# Patient Record
Sex: Male | Born: 1950 | Race: Black or African American | Hispanic: No | Marital: Married
Health system: Southern US, Academic
[De-identification: ages and names within clinical notes are randomized; demographics above are authoritative.]

## PROBLEM LIST (undated history)

## (undated) ENCOUNTER — Telehealth
Attending: Pharmacist Clinician (PhC)/ Clinical Pharmacy Specialist | Primary: Pharmacist Clinician (PhC)/ Clinical Pharmacy Specialist

## (undated) ENCOUNTER — Telehealth

## (undated) ENCOUNTER — Ambulatory Visit: Attending: Family Medicine | Primary: Family Medicine

## (undated) ENCOUNTER — Ambulatory Visit: Payer: MEDICARE | Attending: Family | Primary: Family

## (undated) ENCOUNTER — Ambulatory Visit

## (undated) ENCOUNTER — Telehealth: Attending: Podiatrist | Primary: Podiatrist

## (undated) ENCOUNTER — Encounter

## (undated) ENCOUNTER — Encounter: Attending: Family Medicine | Primary: Family Medicine

## (undated) ENCOUNTER — Ambulatory Visit: Payer: MEDICARE | Attending: Gastroenterology | Primary: Gastroenterology

## (undated) ENCOUNTER — Ambulatory Visit: Payer: MEDICARE | Attending: Podiatrist | Primary: Podiatrist

## (undated) DIAGNOSIS — Z87442 Personal history of urinary calculi: Secondary | ICD-10-CM

## (undated) DIAGNOSIS — E119 Type 2 diabetes mellitus without complications: Secondary | ICD-10-CM

## (undated) HISTORY — PX: ABDOMINAL SURGERY: SHX537

## (undated) HISTORY — PX: FOOT SURGERY: SHX648

## (undated) HISTORY — PX: THROAT SURGERY: SHX803

---

## 2005-11-19 ENCOUNTER — Emergency Department (HOSPITAL_COMMUNITY): Admission: EM | Admit: 2005-11-19 | Discharge: 2005-11-19 | Payer: Self-pay | Admitting: Emergency Medicine

## 2010-04-12 ENCOUNTER — Ambulatory Visit (HOSPITAL_COMMUNITY): Admission: RE | Admit: 2010-04-12 | Discharge: 2010-04-12 | Payer: Self-pay | Admitting: Family Medicine

## 2012-09-10 ENCOUNTER — Emergency Department (HOSPITAL_COMMUNITY)
Admission: EM | Admit: 2012-09-10 | Discharge: 2012-09-10 | Disposition: A | Discharge status: Home / Self Care | Payer: Medicare Other | Attending: Emergency Medicine | Admitting: Emergency Medicine

## 2012-09-10 ENCOUNTER — Encounter (HOSPITAL_COMMUNITY): Payer: Self-pay

## 2012-09-10 DIAGNOSIS — T17208A Unspecified foreign body in pharynx causing other injury, initial encounter: Secondary | ICD-10-CM

## 2012-09-10 DIAGNOSIS — Y9389 Activity, other specified: Secondary | ICD-10-CM | POA: Insufficient documentation

## 2012-09-10 DIAGNOSIS — Y929 Unspecified place or not applicable: Secondary | ICD-10-CM | POA: Insufficient documentation

## 2012-09-10 DIAGNOSIS — IMO0002 Reserved for concepts with insufficient information to code with codable children: Secondary | ICD-10-CM | POA: Insufficient documentation

## 2012-09-10 NOTE — ED Provider Notes (Signed)
Medical screening examination/treatment/procedure(s) were performed by non-physician practitioner and as supervising physician I was immediately available for consultation/collaboration.   Flint Melter, MD 09/10/12 [phone]

## 2012-09-10 NOTE — ED Notes (Signed)
Pt reports eating some fish last night and reports that a fish bone is stuck in his throat.  Pt has tried multiple things at home to remove w/out success.  Pt denies any sob or difficulty breathing.

## 2012-09-10 NOTE — ED Provider Notes (Signed)
History     CSN: [id number]  Arrival date & time 09/10/12  1042   First MD Initiated Contact with Patient 09/10/12 1048      Chief Complaint  Patient presents with  . Swallowed Foreign Body    (Consider location/radiation/quality/duration/timing/severity/associated sxs/prior treatment) Patient is a 62 y.o. male presenting with foreign body swallowed. The history is provided by the patient and the spouse.  Swallowed Foreign Body This is a new problem. The current episode started yesterday. The problem has been unchanged. Associated symptoms include a sore throat. Pertinent negatives include no abdominal pain, arthralgias, chest pain, congestion, coughing, fever, headaches, joint swelling, nausea, neck pain, numbness, rash, swollen glands, vomiting or weakness. Associated symptoms comments: No sob,  No chest pain.. The symptoms are aggravated by swallowing. Treatments tried: He has tried dislodging the bone with his toothbrush,  has also eaten bananas, peanut butter trying to dislodge it. The treatment provided no relief.    History reviewed. No pertinent past medical history.  Past Surgical History  Procedure Date  . Throat surgery   . Foot surgery     No family history on file.  History  Substance Use Topics  . Smoking status: Never Smoker   . Smokeless tobacco: Not on file  . Alcohol Use: Yes      Review of Systems  Constitutional: Negative for fever.  HENT: Positive for sore throat. Negative for congestion and neck pain.   Eyes: Negative.   Respiratory: Negative for cough, choking, chest tightness, shortness of breath, wheezing and stridor.   Cardiovascular: Negative for chest pain.  Gastrointestinal: Negative for nausea, vomiting and abdominal pain.  Genitourinary: Negative.   Musculoskeletal: Negative for joint swelling and arthralgias.  Skin: Negative.  Negative for rash and wound.  Neurological: Negative for dizziness, weakness, light-headedness, numbness and  headaches.  Hematological: Negative.   Psychiatric/Behavioral: Negative.     Allergies  Review of patient's allergies indicates no known allergies.  Home Medications   Current Outpatient Rx  Name  Route  Sig  Dispense  Refill  . HYDROCHLOROTHIAZIDE 12.5 MG PO CAPS   Oral   Take 12.5 mg by mouth daily.           BP 151/78  Pulse 84  Temp 98.3 F (36.8 C) (Oral)  Resp 20  Ht 5\' 10"  (1.778 m)  Wt 225 lb (102.059 kg)  BMI 32.28 kg/m2  SpO2 97%  Physical Exam  Nursing note and vitals reviewed. Constitutional: He is oriented to person, place, and time. He appears well-developed and well-nourished.  HENT:  Head: Normocephalic and atraumatic.       Small fish bone in horizontal position lodged behind left pharyngeal tonsillar fold.  Eyes: Conjunctivae normal are normal.  Neck: Normal range of motion. Neck supple.  Cardiovascular: Normal rate.   Pulmonary/Chest: Effort normal and breath sounds normal. No stridor. No respiratory distress. He has no wheezes.  Abdominal: He exhibits no distension.  Musculoskeletal: Normal range of motion.  Neurological: He is alert and oriented to person, place, and time.  Skin: Skin is warm and dry.  Psychiatric: He has a normal mood and affect.    ED Course  FOREIGN BODY REMOVAL Date/Time: 09/10/2012 11:13 AM Performed by: Burgess Amor Authorized by: Burgess Amor Consent: Verbal consent obtained. Risks and benefits: risks, benefits and alternatives were discussed Consent given by: patient Patient identity confirmed: verbally with patient Time out: Immediately prior to procedure a "time out" was called to verify the correct patient, procedure, equipment,  support staff and site/side marked as required. Body area: throat Local anesthetic: topical anesthetic Localization method: visualized Removal mechanism: alligator forceps Complexity: simple 1 objects recovered. Objects recovered: fish bone Post-procedure assessment: foreign body  removed Patient tolerance: Patient tolerated the procedure well with no immediate complications.   (including critical care time)  Labs Reviewed - No data to display No results found.   1. Foreign body in pharynx       MDM  Prn f/u anticipated.  No complications from simple fish bone removal.        Burgess Amor, PA 09/10/12 1118

## 2019-04-29 DIAGNOSIS — M722 Plantar fascial fibromatosis: Secondary | ICD-10-CM

## 2019-05-10 DIAGNOSIS — B1920 Unspecified viral hepatitis C without hepatic coma: Secondary | ICD-10-CM

## 2019-05-14 ENCOUNTER — Ambulatory Visit: Admit: 2019-05-14 | Discharge: 2019-05-15 | Payer: MEDICARE

## 2019-05-14 DIAGNOSIS — B1920 Unspecified viral hepatitis C without hepatic coma: Secondary | ICD-10-CM

## 2019-05-14 DIAGNOSIS — K8020 Calculus of gallbladder without cholecystitis without obstruction: Secondary | ICD-10-CM

## 2019-05-21 DIAGNOSIS — B1920 Unspecified viral hepatitis C without hepatic coma: Secondary | ICD-10-CM

## 2019-05-28 ENCOUNTER — Encounter (HOSPITAL_COMMUNITY): Payer: Self-pay | Admitting: *Deleted

## 2019-05-28 ENCOUNTER — Other Ambulatory Visit: Payer: Self-pay

## 2019-05-28 ENCOUNTER — Emergency Department (HOSPITAL_COMMUNITY)
Admission: EM | Admit: 2019-05-28 | Discharge: 2019-05-28 | Disposition: A | Discharge status: Home / Self Care | Payer: Medicare HMO | Attending: Emergency Medicine | Admitting: Emergency Medicine

## 2019-05-28 ENCOUNTER — Emergency Department (HOSPITAL_COMMUNITY): Payer: Medicare HMO

## 2019-05-28 DIAGNOSIS — K8020 Calculus of gallbladder without cholecystitis without obstruction: Secondary | ICD-10-CM | POA: Diagnosis not present

## 2019-05-28 DIAGNOSIS — R1031 Right lower quadrant pain: Secondary | ICD-10-CM | POA: Diagnosis present

## 2019-05-28 DIAGNOSIS — R112 Nausea with vomiting, unspecified: Secondary | ICD-10-CM | POA: Insufficient documentation

## 2019-05-28 DIAGNOSIS — K8050 Calculus of bile duct without cholangitis or cholecystitis without obstruction: Secondary | ICD-10-CM

## 2019-05-28 LAB — CBC WITH DIFFERENTIAL/PLATELET
Abs Immature Granulocytes: 0.02 10*3/uL (ref 0.00–0.07)
Basophils Absolute: 0.1 10*3/uL (ref 0.0–0.1)
Basophils Relative: 1 %
Eosinophils Absolute: 0.2 10*3/uL (ref 0.0–0.5)
Eosinophils Relative: 2 %
HCT: 42.9 % (ref 39.0–52.0)
Hemoglobin: 13.2 g/dL (ref 13.0–17.0)
Immature Granulocytes: 0 %
Lymphocytes Relative: 23 %
Lymphs Abs: 2.4 10*3/uL (ref 0.7–4.0)
MCH: 27.7 pg (ref 26.0–34.0)
MCHC: 30.8 g/dL (ref 30.0–36.0)
MCV: 90.1 fL (ref 80.0–100.0)
Monocytes Absolute: 0.7 10*3/uL (ref 0.1–1.0)
Monocytes Relative: 6 %
Neutro Abs: 6.9 10*3/uL (ref 1.7–7.7)
Neutrophils Relative %: 68 %
Platelets: 184 10*3/uL (ref 150–400)
RBC: 4.76 MIL/uL (ref 4.22–5.81)
RDW: 13.5 % (ref 11.5–15.5)
WBC: 10.2 10*3/uL (ref 4.0–10.5)
nRBC: 0 % (ref 0.0–0.2)

## 2019-05-28 LAB — COMPREHENSIVE METABOLIC PANEL WITH GFR
ALT: 33 U/L (ref 0–44)
AST: 34 U/L (ref 15–41)
Albumin: 4.2 g/dL (ref 3.5–5.0)
Alkaline Phosphatase: 74 U/L (ref 38–126)
Anion gap: 7 (ref 5–15)
BUN: 13 mg/dL (ref 8–23)
CO2: 26 mmol/L (ref 22–32)
Calcium: 9.2 mg/dL (ref 8.9–10.3)
Chloride: 101 mmol/L (ref 98–111)
Creatinine, Ser: 0.91 mg/dL (ref 0.61–1.24)
GFR calc Af Amer: >60 mL/min
GFR calc non Af Amer: >60 mL/min
Glucose, Bld: 189 mg/dL — ABNORMAL HIGH (ref 70–99)
Potassium: 4.1 mmol/L (ref 3.5–5.1)
Sodium: 134 mmol/L — ABNORMAL LOW (ref 135–145)
Total Bilirubin: 0.5 mg/dL (ref 0.3–1.2)
Total Protein: 8 g/dL (ref 6.5–8.1)

## 2019-05-28 LAB — URINALYSIS, ROUTINE W REFLEX MICROSCOPIC
Bilirubin Urine: NEGATIVE
Glucose, UA: NEGATIVE mg/dL
Hgb urine dipstick: NEGATIVE
Ketones, ur: NEGATIVE mg/dL
Leukocytes,Ua: NEGATIVE
Nitrite: NEGATIVE
Protein, ur: NEGATIVE mg/dL
Specific Gravity, Urine: 1.023 (ref 1.005–1.030)
pH: 6 (ref 5.0–8.0)

## 2019-05-28 LAB — LIPASE, BLOOD: Lipase: 21 U/L (ref 11–51)

## 2019-05-28 MED ORDER — HYDROMORPHONE HCL 1 MG/ML IJ SOLN
1.0000 mg | Freq: Once | INTRAMUSCULAR | Status: AC
  Administered 2019-05-28: 16:00:00 1 mg via INTRAVENOUS
  Filled 2019-05-28: qty 1

## 2019-05-28 MED ORDER — ONDANSETRON 4 MG PO TBDP: 4.0000 mg | ORAL_TABLET | Freq: Three times a day (TID) | ORAL | 1 refills | Status: DC | PRN

## 2019-05-28 MED ORDER — ONDANSETRON HCL 4 MG/2ML IJ SOLN
4.0000 mg | Freq: Once | INTRAMUSCULAR | Status: AC
  Administered 2019-05-28: 4 mg via INTRAVENOUS
  Filled 2019-05-28: qty 2

## 2019-05-28 MED ORDER — SODIUM CHLORIDE 0.9 % IV SOLN
INTRAVENOUS | Status: DC
  Administered 2019-05-28: 16:00:00 via INTRAVENOUS

## 2019-05-28 MED ORDER — HYDROCODONE-ACETAMINOPHEN 5-325 MG PO TABS: 1.0000 | ORAL_TABLET | Freq: Four times a day (QID) | ORAL | 0 refills | Status: DC | PRN

## 2019-05-28 NOTE — Discharge Instructions (Signed)
Call in Monday and make an appointment to follow-up with general surgery for consideration to have the gallbladder removed.  Take the hydrocodone as needed for pain.  The pain does not subside over a period of 3 hours or you develop fevers or persistent vomiting return for reevaluation.  Also given a prescription for antinausea medicine.

## 2019-05-28 NOTE — ED Triage Notes (Signed)
Patient presents to the ED with right sided flank pain for one day.  Patient denies urinary symptoms, but does have kidney stone history approximately 3 years ago.

## 2019-05-28 NOTE — ED Provider Notes (Signed)
[facility] Provider Note   CSN: [id number] Arrival date & time: 05/28/19  1403     History   Chief Complaint Chief Complaint  Patient presents with  . Flank Pain    right    HPI Samuel Washington is a 68 y.o. male.     Patient with acute onset of right-sided back pain right flank pain and right sided abdominal pain last evening.  Associated with nausea vomiting and diaphoresis.  Not vomiting any blood.  Reminds patients of past kidney stones.  Denies any fevers any upper respiratory symptoms no diarrhea.  Pain has been persistent.     No past medical history on file.  There are no active problems to display for this patient.   Past Surgical History:  Procedure Laterality Date  . FOOT SURGERY    . THROAT SURGERY          Home Medications    Prior to Admission medications   Medication Sig Start Date End Date Taking? Authorizing Provider  hydrochlorothiazide (MICROZIDE) 12.5 MG capsule Take 12.5 mg by mouth daily.    [provider]  HYDROcodone-acetaminophen (NORCO/VICODIN) 5-325 MG tablet Take 1 tablet by mouth every 6 (six) hours as needed. 05/28/19   Vanetta MuldersZackowski, Shannah Conteh, MD  ondansetron (ZOFRAN ODT) 4 MG disintegrating tablet Take 1 tablet (4 mg total) by mouth every 8 (eight) hours as needed. 05/28/19   Vanetta MuldersZackowski, Shanyce Daris, MD    Family History No family history on file.  Social History Social History   Tobacco Use  . Smoking status: Never Smoker  . Smokeless tobacco: Never Used  Substance Use Topics  . Alcohol use: Yes  . Drug use: No     Allergies   Patient has no known allergies.   Review of Systems Review of Systems  Constitutional: Positive for diaphoresis. Negative for chills and fever.  HENT: Negative for congestion, rhinorrhea and sore throat.   Eyes: Negative for visual disturbance.  Respiratory: Negative for cough and shortness of breath.   Cardiovascular: Negative for chest pain and leg swelling.   Gastrointestinal: Positive for abdominal pain, nausea and vomiting. Negative for diarrhea.  Genitourinary: Positive for flank pain. Negative for dysuria.  Musculoskeletal: Positive for back pain. Negative for neck pain.  Skin: Negative for rash.  Neurological: Negative for dizziness, syncope, light-headedness and headaches.  Hematological: Does not bruise/bleed easily.  Psychiatric/Behavioral: Negative for confusion.     Physical Exam Updated Vital Signs BP 116/72 (BP Location: Right Arm)   Pulse (!) 58   Temp 97.7 F (36.5 C) (Oral)   Resp 17   Ht 1.778 m (5\' 10" )   Wt 97.5 kg   SpO2 100%   BMI 30.85 kg/m   Physical Exam Vitals signs and nursing note reviewed.  Constitutional:      Appearance: Normal appearance. He is well-developed.  HENT:     Head: Normocephalic and atraumatic.  Eyes:     Extraocular Movements: Extraocular movements intact.     Conjunctiva/sclera: Conjunctivae normal.     Pupils: Pupils are equal, round, and reactive to light.  Neck:     Musculoskeletal: Neck supple.  Cardiovascular:     Rate and Rhythm: Normal rate and regular rhythm.     Heart sounds: No murmur.  Pulmonary:     Effort: Pulmonary effort is normal. No respiratory distress.     Breath sounds: Normal breath sounds.  Abdominal:     Palpations: Abdomen is soft.     Tenderness: There  is no abdominal tenderness.  Musculoskeletal: Normal range of motion.  Skin:    General: Skin is warm and dry.  Neurological:     General: No focal deficit present.     Mental Status: He is alert and oriented to person, place, and time.      ED Treatments / Results  Labs (all labs ordered are listed, but only abnormal results are displayed) Labs Reviewed  COMPREHENSIVE METABOLIC PANEL - Abnormal; Notable for the following components:      Result Value   Sodium 134 (*)    Glucose, Bld 189 (*)    All other components within normal limits  LIPASE, BLOOD  CBC WITH DIFFERENTIAL/PLATELET   URINALYSIS, ROUTINE W REFLEX MICROSCOPIC    EKG None  Radiology US Abdomen Limited  Result Date: 05/28/2019 CLINICAL DATA:  68 year old male with right upper quadrant abdominal pain. EXAM: ULTRASOUND ABDOMEN LIMITED RIGHT UPPER QUADRANT COMPARISON:  Right upper quadrant ultrasound dated 11/19/2005 and CT of the abdomen pelvis dated 05/18/2019 FINDINGS: Gallbladder: There is a 2 cm stone in the gallbladder. There is no gallbladder wall thickening or pericholecystic fluid. Negative sonographic Murphy's sign. Common bile duct: Diameter: 9 mm. The common bile duct is minimally dilated. No definite stone noted in the visualized CBD. Liver: The liver is unremarkable. Portal vein is patent on color Doppler imaging with normal direction of blood flow towards the liver. Other: None. IMPRESSION: Cholelithiasis without sonographic evidence of acute cholecystitis. Electronically Signed   By: Elgie Collard M.D.   On: 05/28/2019 16:42   Ct Renal Stone Study  Result Date: 05/28/2019 CLINICAL DATA:  Flank pain. Evaluate for stone disease. EXAM: CT ABDOMEN AND PELVIS WITHOUT CONTRAST TECHNIQUE: Multidetector CT imaging of the abdomen and pelvis was performed following the standard protocol without IV contrast. COMPARISON:  4/10/7 FINDINGS: Lower chest: No acute abnormality. Hepatobiliary: No focal liver abnormality is seen. Stone within the gallbladder measures 2.6 cm. No intrahepatic bile duct dilatation. Mild increase caliber of the common bile duct measures up to 8 mm proximally. Distal common bile duct has a normal caliber. Pancreas: Unremarkable. No pancreatic ductal dilatation or surrounding inflammatory changes. Spleen: Normal in size without focal abnormality. Adrenals/Urinary Tract: Normal appearance of the adrenal glands. The kidneys are unremarkable. No kidney stone or hydronephrosis. No hydronephrosis, hydroureter, or ureteral lithiasis. Stomach/Bowel: There is moderate distension of the stomach. No  bowel wall thickening, no bowel wall thickening, inflammation or distension. Diffuse colonic diverticulosis without acute diverticulitis. The appendix is not confidently identified. No secondary signs of acute appendicitis noted in the right lower quadrant of the abdomen. Vascular/Lymphatic: Aortic atherosclerosis. No aneurysm. Abdominopelvic adenopathy. Reproductive: Prostate is unremarkable. Other: No free fluid or fluid collections. 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 No acute or significant osseous findings. IMPRESSION: 1. No acute findings within the abdomen or pelvis. No kidney stones or hydronephrosis. 2. Gallstones. Aortic Atherosclerosis (ICD10-I70.0). Electronically Signed   By: Signa Kell M.D.   On: 05/28/2019 16:06    Procedures Procedures (including critical care time)  Medications Ordered in ED Medications  0.9 %  sodium chloride infusion ( Intravenous New Bag/Given 05/28/19 1539)  ondansetron (ZOFRAN) injection 4 mg (4 mg Intravenous Given 05/28/19 1541)  HYDROmorphone (DILAUDID) injection 1 mg (1 mg Intravenous Given 05/28/19 1543)     Initial Impression / Assessment and Plan / ED Course  I have reviewed the triage vital signs and the nursing notes.  Pertinent labs & imaging results that were available during  my care of the patient were reviewed by me and considered in my medical decision making (see chart for details).       Patient with complete pain relief with the hydromorphone.  CT scan consistent with gallstones.  Ultrasound also performed no evidence of acute cholecystitis.  Labs normal no significant leukocytosis no liver function test abnormalities.  Patient's right upper quadrant nontender to palpation.  Symptoms represent biliary colic.  Patient stable for discharge home.  Will be given a short course of hydrocodone patient was instructed to avoid fatty foods.  Patient also given Zofran for any nausea or vomiting will  call Dr. Constance Haw general surgery for follow-up on Monday.  Patient knows to return for any persistent pain fever persistent vomiting.   Final Clinical Impressions(s) / ED Diagnoses   Final diagnoses:  Biliary colic  Calculus of gallbladder without cholecystitis without obstruction    ED Discharge Orders         Ordered    HYDROcodone-acetaminophen (NORCO/VICODIN) 5-325 MG tablet  Every 6 hours PRN     05/28/19 1800    ondansetron (ZOFRAN ODT) 4 MG disintegrating tablet  Every 8 hours PRN     05/28/19 1800           Fredia Sorrow, MD 05/28/19 1802

## 2019-06-15 ENCOUNTER — Ambulatory Visit: Admit: 2019-06-15 | Discharge: 2019-06-15 | Payer: MEDICARE | Attending: Podiatrist | Primary: Podiatrist

## 2019-06-15 ENCOUNTER — Ambulatory Visit: Admit: 2019-06-15 | Discharge: 2019-06-15 | Payer: MEDICARE

## 2019-06-15 DIAGNOSIS — M79672 Pain in left foot: Principal | ICD-10-CM

## 2019-06-15 DIAGNOSIS — M2040 Other hammer toe(s) (acquired), unspecified foot: Principal | ICD-10-CM

## 2019-06-15 DIAGNOSIS — M79671 Pain in right foot: Principal | ICD-10-CM

## 2019-06-15 DIAGNOSIS — M722 Plantar fascial fibromatosis: Principal | ICD-10-CM

## 2019-09-01 ENCOUNTER — Telehealth: Admit: 2019-09-01 | Discharge: 2019-09-02 | Payer: MEDICARE | Attending: Gastroenterology | Primary: Gastroenterology

## 2019-09-01 DIAGNOSIS — B1920 Unspecified viral hepatitis C without hepatic coma: Principal | ICD-10-CM

## 2019-09-01 DIAGNOSIS — B182 Chronic viral hepatitis C: Principal | ICD-10-CM

## 2019-09-08 ENCOUNTER — Ambulatory Visit: Admit: 2019-09-08 | Discharge: 2019-09-09 | Payer: MEDICARE | Attending: Gastroenterology | Primary: Gastroenterology

## 2019-09-08 DIAGNOSIS — B182 Chronic viral hepatitis C: Principal | ICD-10-CM

## 2019-09-29 DIAGNOSIS — B182 Chronic viral hepatitis C: Principal | ICD-10-CM

## 2019-09-29 MED ORDER — SOFOSBUVIR 400 MG-VELPATASVIR 100 MG TABLET
ORAL_TABLET | Freq: Every day | ORAL | 2 refills | 28.00000 days | Status: CP
Start: 2019-09-29 — End: ?
  Filled 2019-10-11: qty 28, 28d supply, fill #0

## 2019-09-30 DIAGNOSIS — B182 Chronic viral hepatitis C: Principal | ICD-10-CM

## 2019-10-08 NOTE — Unmapped (Signed)
[facility] Specialty Medication Onboarding    Specialty Medication: Architectural technologist  Prior Authorization: Approved   Financial Assistance: Yes - grant approved as secondary   Final Copay/Day Supply: $0 / 28 DAYS    Insurance Restrictions: Yes - max 1 month supply     Notes to Pharmacist:     The triage team has completed the benefits investigation and has determined that the patient is able to fill this medication at [facility]. Please contact the patient to complete the onboarding or follow up with the prescribing physician as needed.

## 2019-10-08 NOTE — Unmapped (Signed)
[facility]   Patient Onboarding/Medication Counseling    Jonathan Potter is a 69 y.o. male with Hepatitis C who I am counseling today on initiation of therapy.  I am speaking to the patient.    Was a Nurse, learning disability used for this call? No    Verified patient's date of birth / HIPAA.    Specialty medication(s) to be sent: Infectious Disease: sofosbuvir/velpatasvir      Non-specialty medications/supplies to be sent: n/a      Medications not needed at this time: n/a         Epclusa (sofosbuvir and velpatasvir) 400mg /100mg     Planned Start Date: 10/13/2019    Has the patient been told to call and make a 4 week follow-up appointment at the liver clinic after their medicine has been received? Yes, he was told to call [phone], option 1, option 1      Medication & Administration     Dosage: Take one tablet by mouth daily for 12 weeks.    Administration:  ? Take with or without food.  ? Antacids: Separate the administration of Epclusa and antacids by at least 4 hours.  ? H2 Receptor Antagonist: Administer H2 receptor antagonist doses less than or comparable to famotidine 40 mg twice daily simultaneously or 12 hours apart from [place].   ? Proton Pump Inhibitor (PPI): Epclusa should be administered with food and taken 4 hours before omeprazole max dose of 20mg .      Adherence/Missed dose instructions:  ? Take missed dose as soon as you remember. If it is close to the time of your next dose, skip the missed dose and resume with your next scheduled dose.  ? Do not take extra doses or 2 doses at the same time.  ? Avoid missing doses as it can result in development of resistance.    Goals of Therapy     The goal of therapy is to cure the patient of Hepatitis C. Patients who have undetectable HCV RNA in the serum, as assessed by a sensitive polymerase chain reaction (PCR) assay, ?12 weeks after treatment completion are deemed to have achieved SVR (cure). (www.hcvguidelines.org)    Side Effects & Monitoring Parameters     Common Side Effects:   ? Headache  ? Fatigue     To help minimize some of the side effects: (http://www.velazquez.com/)  ? Stay hydrated by drinking plenty of water and limiting caffeinated beverages such as coffee, tea and soda.  ? Do your hardest chores when you have the most energy.  ? Take short naps of 20 minutes or less that are and not too close to bedtime.    The following side effects should be reported to the provider:   ? Signs of liver problems like dark urine, feeling excessively tired, lack of hunger, upset stomach or stomach pain, light-colored stools, throwing up, or yellow skin/eyes.  ? Signs of an allergic reaction, such as rash; hives; itching; red, swollen, blistered, or peeling skin with or without fever. If you have wheezing; tightness in the chest or throat; trouble breathing, swallowing, or talking; unusual hoarseness; or swelling of the mouth, face, lips, tongue, or throat, call 911 or go to the closest emergency department (ED).     Monitoring Parameters: (AASLD-IDSA Guidelines, 2019)    Within 6 months prior to starting treatment:  ? Complete blood count (CBC).  ? International normalized ratio (INR) if clinically indicated.   ? Hepatic function panel: albumin, total and direct bilirubin, ALT,  AST, and Alkaline Phosphatase levels.  ? Calculated glomerular filtration rate (eGFR).  ? Pregnancy test within 1 month of starting treatment for females of childbearing age  Any time prior to starting treatment:  ? Test for HCV genotype.  ? Assess for hepatic fibrosis.  ? Quantitative HCV RNA (HCV viral load).  ? Assess for active HBV coinfection: HBV surface antigen (HBsAg); If HBsAg positive, then should be assessed for whether HBV DNA level meets AASLD criteria for HBV treatment.  ? Assess for evidence of prior HBV infection and immunity: HBV core antibody (anti-HBc) and HBV surface antibody (anti-HBS).   ? Assess for HIV coinfection.  ? Test for the presence of resistance-associated substitutions (RASs) prior to starting treatment if clinically indicated.    Monitoring during treatment:   ? Diabetics should monitor for signs of hypoglycemia.  ? Patients on warfarin should monitor for changes in INR.  ? Pregnancy test monthly in females of childbearing age  ? For HBsAg positive patients not already on HBV therapy because baseline HBV DNA level does not meet treatment criteria:   o Initiate prophylactic HBV antiviral therapy OR  o Monitor HBV DNA levels monthly during and immediately after Epclusa therapy.    After treatment to document a cure:   ? Quantitative HCV RNA12 weeks after completion of therapy.    Contraindications, Warnings, & Precautions     ? Hepatitis B reactivation.  ? Bradycardia with amiodarone coadministration: Coadministration not recommended. If unavoidable, patients should have inpatient cardiac monitoring for 48 hours after first Epclusa dose, followed by daily outpatient or self-monitoring of heart rate for at least the first 2 weeks of treatment. Due to the long half-life of amiodarone, cardiac monitoring is also recommended if amiodarone was discontinued just prior to beginning treatment.     Drug/Food Interactions     ? Medication list reviewed in Epic. The patient was instructed to inform the care team before taking any new medications or supplements. Drug interactions are as follows:.  o Metformin: I told him to monitor his blood glucose and that his metformin dose may need adjusting in the future as the liver starts to function better with the Hepatitis C elimitnated  o Atorvastatin: The Epclusa may increase the serum concentration of atorvastatin and that a dose reduction of statin may be necessary. I told him  to self-report potential side effects of increased concentrations such as muscle pain.  ? Encourage minimizing alcohol intake.  ? Antacids: Separate the administration of Epclusa and antacids by at least 4 hours.: n/a  ? H2 Receptor Antagonists: Administer H2 receptor antagonist doses less than or comparable to famotidine 40 mg twice daily simultaneously or 12 hours apart from [place].: n/a  ? Proton pump inhibitors: Not recommended. If medically necessary, Epclusa should be administered with food and taken 4 hours before omeprazole at a maximum dose of 20mg . Other proton pump inhibitors have not been studied.: n/a  ? Potential interactions: Clearance of HCV infection with direct acting antivirals may lead to changes in hepatic function, which may impact the safe and effective use of concomitant medications. Frequent monitoring of relevant laboratory parameters (e.g., International Normalized Ratio [INR] in patients taking warfarin, blood glucose levels in diabetic patients) or drug concentrations of concomitant medications such as cytochrome P450 substrates with a narrow therapeutic index (e.g. certain immunosuppressants) is recommended to ensure safe and effective use. Dose adjustments of concomitant medications may be necessary.    Storage, Handling Precautions, & Disposal     ?  Store this medication in the original container at room temperature.  ? Store in a dry place. Do not store in a bathroom.  ? Keep all drugs in a safe place. Keep all drugs out of the reach of children and pets.  ? Disposal: You should NOT have any Epclusa left over to throw away      Current Medications (including OTC/herbals), Comorbidities and Allergies     Current Outpatient Medications   Medication Sig Dispense Refill   ??? atorvastatin (LIPITOR) 40 MG tablet Take 40 mg by mouth daily.     ??? gabapentin (NEURONTIN) 400 MG capsule Take 400 mg by mouth Two (2) times a day.     ??? lisinopriL-hydrochlorothiazide (PRINZIDE,ZESTORETIC) 20-12.5 mg per tablet Take 12.5 tablets by mouth daily.     ??? metFORMIN (GLUCOPHAGE) 500 MG tablet Take 500 mg by mouth 2 (two) times a day with meals.     ??? sofosbuvir-velpatasvir (EPCLUSA) 400-100 mg tablet Take 1 tablet by mouth daily. 28 tablet 2   ??? traMADoL (ULTRAM) 50 mg tablet        No current facility-administered medications for this visit.        No Known Allergies    There is no problem list on file for this patient.      Reviewed and up to date in Epic.    Appropriateness of Therapy     Is medication and dose appropriate based on diagnosis? Yes    Prescription has been clinically reviewed: Yes    Baseline Quality of Life Assessment      How many days over the past month did your Hepatitis C  keep you from your normal activities? For example, brushing your teeth or getting up in the morning. 0    Financial Information     Medication Assistance provided: Prior Authorization and [employer]    Anticipated copay of $0.00 reviewed with patient. Verified delivery address.    Delivery Information     Scheduled delivery date: 10/12/2019    Expected start date: 10/13/2019    Medication will be delivered via UPS to the prescription address in [facility].  This shipment will not require a signature.      Explained the services we provide at [facility] and that each month we would call to set up refills.  Stressed importance of returning phone calls so that we could ensure they receive their medications in time each month.  Informed patient that we should be setting up refills 7-10 days prior to when they will run out of medication.  A pharmacist will reach out to perform a clinical assessment periodically.  Informed patient that a welcome packet and a drug information handout will be sent.      Patient verbalized understanding of the above information as well as how to contact the pharmacy at [phone] option 4 with any questions/concerns.  The pharmacy is open Monday through Friday 8:30am-4:30pm.  A pharmacist is available 24/7 via pager to answer any clinical questions they may have.    Patient Specific Needs     ? Does the patient have any physical, cognitive, or cultural barriers? No    ? Patient prefers to have medications discussed with  Patient     ? Is the patient or caregiver able to read and understand education materials at a high school level or above? Yes    ? Patient's primary language is  English     ? Is the  patient high risk? No     ? Does the patient require a Care Management Plan? No     ? Does the patient require physician intervention or other additional services (i.e. nutrition, smoking cessation, social work)? No      Roderic Palau  [facility] Specialty Pharmacist

## 2019-10-11 MED FILL — EPCLUSA 400 MG-100 MG TABLET: 28 days supply | Qty: 28 | Fill #0 | Status: AC

## 2019-11-03 NOTE — Unmapped (Signed)
[facility] Clinical Assessment & Refill Coordination Note    Sherlock Nancarrow, DOB: [birth year]  Phone: [phone] (home)     All above HIPAA information was verified with patient.     Was a Nurse, learning disability used for this call? No    Specialty Medication(s):   Infectious Disease: Epclusa     Current Outpatient Medications   Medication Sig Dispense Refill   ??? atorvastatin (LIPITOR) 40 MG tablet Take 40 mg by mouth daily.     ??? gabapentin (NEURONTIN) 400 MG capsule Take 400 mg by mouth Two (2) times a day.     ??? lisinopriL-hydrochlorothiazide (PRINZIDE,ZESTORETIC) 20-12.5 mg per tablet Take 12.5 tablets by mouth daily.     ??? metFORMIN (GLUCOPHAGE) 500 MG tablet Take 500 mg by mouth 2 (two) times a day with meals.     ??? sofosbuvir-velpatasvir (EPCLUSA) 400-100 mg tablet Take 1 tablet by mouth daily. 28 tablet 2   ??? traMADoL (ULTRAM) 50 mg tablet        No current facility-administered medications for this visit.         Changes to medications: Ansel reports no changes at this time.    No Known Allergies    Changes to allergies: No    SPECIALTY MEDICATION ADHERENCE     Epclusa   : 11 days of medicine on hand after today      Medication Adherence    Patient reported X missed doses in the last month: 0  Specialty Medication: Epclusa  Patient is on additional specialty medications: No  Demonstrates understanding of importance of adherence: yes  Informant: patient  Provider-estimated medication adherence level: good  Patient is at risk for Non-Adherence: No          Specialty medication(s) dose(s) confirmed: Regimen is correct and unchanged.     Are there any concerns with adherence? No    Adherence counseling provided? Not needed    CLINICAL MANAGEMENT AND INTERVENTION      Clinical Benefit Assessment:    Do you feel the medicine is effective or helping your condition? Yes    Clinical Benefit counseling provided? Not needed    Adverse Effects Assessment:    Are you experiencing any side effects? Yes, patient reports experiencing headaches and fatigue. Side effect counseling provided: I told him to make sure he drinks at least 6 to 8 glasses of water daily and to try to get some extra rest and take a 20 minute nap during the day if possible    Are you experiencing difficulty administering your medicine? No    Quality of Life Assessment:    How many days over the past month did your Hep C  keep you from your normal activities? For example, brushing your teeth or getting up in the morning. 0    Have you discussed this with your provider? Not needed    Therapy Appropriateness:    Is therapy appropriate? Yes, therapy is appropriate and should be continued    DISEASE/MEDICATION-SPECIFIC INFORMATION      For Hepatitis C patients (clinical assessment):  Regimen: Epclusa x 12 weeks  Therapy start date: 10/18/2019  Completed Treatment Week #: 2  What time of day do you take your medicine? Morning  Pill count over the phone revealed #11 tablets which is appropriate.    Are you taking any new OTC or herbal medication? No  Any alcoholic beverages? Yes, he said he has an occasional beer.  I emphasized it is  important to keep alcohol intake to a minimum.  I told him it would be best to avoid alcohol.   Do you have a follow up appointment? No, appointment is not scheduled I transferred him to [phone] and told him to select option1, an d then option 1 a second time.  I just checked to see if the appointment is scheduled and it is still not scheduled.    PATIENT SPECIFIC NEEDS     ? Does the patient have any physical, cognitive, or cultural barriers? No    ? Is the patient high risk? No     ? Does the patient require a Care Management Plan? No     ? Does the patient require physician intervention or other additional services (i.e. nutrition, smoking cessation, social work)? No      SHIPPING     Specialty Medication(s) to be Shipped:   Infectious Disease: Epclusa    Other medication(s) to be shipped: n/a     Changes to insurance: No    Delivery Scheduled: Yes, Expected medication delivery date: 11/10/2019.     Medication will be delivered via UPS to the confirmed prescription address in [facility].    The patient will receive a drug information handout for each medication shipped and additional FDA Medication Guides as required.  Verified that patient has previously received a Conservation officer, historic buildings.    All of the patient's questions and concerns have been addressed.    Roderic Palau   [facility] Specialty Pharmacist

## 2019-11-03 NOTE — Unmapped (Signed)
Sent message to schedulers to schedule patient for TW#4 visit, prefer in person visit.

## 2019-11-09 MED FILL — EPCLUSA 400 MG-100 MG TABLET: ORAL | 28 days supply | Qty: 28 | Fill #1

## 2019-11-09 MED FILL — EPCLUSA 400 MG-100 MG TABLET: 28 days supply | Qty: 28 | Fill #1 | Status: AC

## 2019-11-26 NOTE — Unmapped (Addendum)
Received fax confirmation on labs faxed to [facility]. Called patient to let him know. Call went to voicemail and unable to leave a message.     4:50 PM  Patient had left a message but I was on the phone and couldn't answer.I called him back and spoke with him. I told him I sent the lab orders to [facility]. He plans to go Monday to have labs done.

## 2019-11-26 NOTE — Unmapped (Signed)
This patient has been disenrolled from the [facility] specialty pharmacy services due to therapy completion - expected therapy completion date: 06/01-06/02/21. Last shipment of Epclusa will be received on 04/21*2021.    Roderic Palau  [facility] Specialty Pharmacist

## 2019-11-26 NOTE — Unmapped (Signed)
[facility] Refill Coordination Note    Specialty Medication(s) to be Shipped:   Infectious Disease: Epclusa    Other medication(s) to be shipped: n/a     Jonathan Potter, DOB: 1951/08/09  Phone: [phone] (home)       All above HIPAA information was verified with patient.     Was a Nurse, learning disability used for this call? No    Completed refill call assessment today to schedule patient's medication shipment from the [facility] [phone]).       Specialty medication(s) and dose(s) confirmed: Regimen is correct and unchanged.   Changes to medications: Bayron reports no changes at this time.  Changes to insurance: No  Questions for the pharmacist: No    Confirmed patient received Welcome Packet with first shipment. The patient will receive a drug information handout for each medication shipped and additional FDA Medication Guides as required.       DISEASE/MEDICATION-SPECIFIC INFORMATION        For Hepatitis C patients (refill assessment): Has anything changed regarding the time you take your medications? No    SPECIALTY MEDICATION ADHERENCE     Medication Adherence    Patient reported X missed doses in the last month: 2  Specialty Medication: Epclusa 400-100mg   Patient is on additional specialty medications: No  Informant: patient           Epclusa   : unknown number of  days of medicine on hand .        SHIPPING     Shipping address confirmed in Epic.     Delivery Scheduled: Yes, Expected medication delivery date: 12/01/19.     Medication will be delivered via UPS to the prescription address in Epic [facility].    Roderic Palau   [facility] Specialty Pharmacist

## 2019-11-26 NOTE — Unmapped (Signed)
Follow-Up Counseling for HCV Treatment      Regimen: Epclusa (sofosbuvir/velpatasvir 400/100mg ) x 12 weeks  Start Date: 10/18/19  Completed Treatment Week #5    Pharmacy: [facility] [phone] option #4    HIPAA information was verified with patient. Following topics were reviewed during the phone call:    1. Medication appropriateness and administration - Takes Epclusa daily in the monrings. Regimen is correct and unchanged.     2. Importance of adherence - Reports 1-2 missed doses due to forgetfulness/busy at times. He takes Epclusa with his morning meds so when he misses, he's missing all his morning meds. Stressed importance of adherence to avoid resistance and to ensure successful outcome. Pt reports he's unable to perform pill count over the phone at this time. Requested pt to call me back with his count.     3. Side effects - Reports fatigue, thirst and headaches. Advised to increase his fluid intake and ok to take acetaminophen up to 2000mg /day. Also advised thirst is sign of high blood sugars but pt reports his BS has been good.     4. Changes to medications: Jonathan Potter reports no changes reported at this time.      5. Follow up - Does not have a follow up appointment scheduled in HCV treatment clinic.  Any barriers to coming to appointment? Yes he lives far away so he would like labs completed at [facility] and do a virtual appointment. Will request Kriste Basque to  Provided [facility] scheduling phone #[phone], option #1, option #1.    6. Refill coordination: Confirmed pt's address and advised will have [facility] schedule delivery on either 12/01/19 or 12/02/19.     All questions were answered.    Park Breed, Pharm D., BCPS, BCGP, CPP  [facility]  [address]  [place], [place] [address]  [phone]    November 26, 2019 8:59 AM

## 2019-11-30 MED FILL — EPCLUSA 400 MG-100 MG TABLET: 28 days supply | Qty: 28 | Fill #2 | Status: AC

## 2019-11-30 MED FILL — EPCLUSA 400 MG-100 MG TABLET: ORAL | 28 days supply | Qty: 28 | Fill #2

## 2020-03-12 ENCOUNTER — Emergency Department (HOSPITAL_COMMUNITY)
Admission: EM | Admit: 2020-03-12 | Discharge: 2020-03-12 | Disposition: A | Discharge status: Home / Self Care | Payer: Medicare HMO | Attending: Emergency Medicine | Admitting: Emergency Medicine

## 2020-03-12 ENCOUNTER — Encounter (HOSPITAL_COMMUNITY): Payer: Self-pay | Admitting: Emergency Medicine

## 2020-03-12 ENCOUNTER — Other Ambulatory Visit: Payer: Self-pay

## 2020-03-12 DIAGNOSIS — E860 Dehydration: Secondary | ICD-10-CM | POA: Insufficient documentation

## 2020-03-12 DIAGNOSIS — E669 Obesity, unspecified: Secondary | ICD-10-CM | POA: Insufficient documentation

## 2020-03-12 DIAGNOSIS — Z79899 Other long term (current) drug therapy: Secondary | ICD-10-CM | POA: Diagnosis not present

## 2020-03-12 DIAGNOSIS — E119 Type 2 diabetes mellitus without complications: Secondary | ICD-10-CM | POA: Insufficient documentation

## 2020-03-12 DIAGNOSIS — R55 Syncope and collapse: Secondary | ICD-10-CM | POA: Insufficient documentation

## 2020-03-12 DIAGNOSIS — Z6830 Body mass index (BMI) 30.0-30.9, adult: Secondary | ICD-10-CM | POA: Insufficient documentation

## 2020-03-12 HISTORY — DX: Type 2 diabetes mellitus without complications: E11.9

## 2020-03-12 LAB — CBC WITH DIFFERENTIAL/PLATELET
Abs Immature Granulocytes: 0.02 10*3/uL (ref 0.00–0.07)
Basophils Absolute: 0.1 10*3/uL (ref 0.0–0.1)
Basophils Relative: 1 %
Eosinophils Absolute: 0.2 10*3/uL (ref 0.0–0.5)
Eosinophils Relative: 2 %
HCT: 41.9 % (ref 39.0–52.0)
Hemoglobin: 13.2 g/dL (ref 13.0–17.0)
Immature Granulocytes: 0 %
Lymphocytes Relative: 24 %
Lymphs Abs: 2 10*3/uL (ref 0.7–4.0)
MCH: 28.6 pg (ref 26.0–34.0)
MCHC: 31.5 g/dL (ref 30.0–36.0)
MCV: 90.7 fL (ref 80.0–100.0)
Monocytes Absolute: 0.6 10*3/uL (ref 0.1–1.0)
Monocytes Relative: 7 %
Neutro Abs: 5.6 10*3/uL (ref 1.7–7.7)
Neutrophils Relative %: 66 %
Platelets: 160 10*3/uL (ref 150–400)
RBC: 4.62 MIL/uL (ref 4.22–5.81)
RDW: 14.7 % (ref 11.5–15.5)
WBC: 8.4 10*3/uL (ref 4.0–10.5)
nRBC: 0 % (ref 0.0–0.2)

## 2020-03-12 LAB — CBG MONITORING, ED: Glucose-Capillary: 128 mg/dL — ABNORMAL HIGH (ref 70–99)

## 2020-03-12 LAB — BASIC METABOLIC PANEL
Anion gap: 11 (ref 5–15)
BUN: 19 mg/dL (ref 8–23)
CO2: 20 mmol/L — ABNORMAL LOW (ref 22–32)
Calcium: 8.7 mg/dL — ABNORMAL LOW (ref 8.9–10.3)
Chloride: 108 mmol/L (ref 98–111)
Creatinine, Ser: 1.4 mg/dL — ABNORMAL HIGH (ref 0.61–1.24)
GFR calc Af Amer: 60 mL/min — ABNORMAL LOW (ref >60)
GFR calc non Af Amer: 52 mL/min — ABNORMAL LOW (ref >60)
Glucose, Bld: 117 mg/dL — ABNORMAL HIGH (ref 70–99)
Potassium: 3.9 mmol/L (ref 3.5–5.1)
Sodium: 139 mmol/L (ref 135–145)

## 2020-03-12 MED ORDER — SODIUM CHLORIDE 0.9 % IV BOLUS
1000.0000 mL | Freq: Once | INTRAVENOUS | Status: AC
  Administered 2020-03-12: 1000 mL via INTRAVENOUS

## 2020-03-12 NOTE — ED Provider Notes (Signed)
[facility] EMERGENCY DEPARTMENT Provider Note   CSN: [id number] Arrival date & time: 03/12/20  1857     History Chief Complaint  Patient presents with  . Near Syncope    Samuel Washington is a 69 y.o. male.  HPI 69 year old male presents with near syncope.  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  He also states he had a beer and a shot.  It was hot outside.  He is feeling progressively lightheaded and almost passed out.  Denies significant headache and denies any chest pain, shortness of breath, palpitations.  Does not think he actually passed out.  Feeling a little better now.  EMS noted him to be hypotensive and gave some fluids.   Past Medical History:  Diagnosis Date  . Diabetes mellitus without complication (HCC)     There are no problems to display for this patient.   Past Surgical History:  Procedure Laterality Date  . ABDOMINAL SURGERY    . FOOT SURGERY    . THROAT SURGERY         No family history on file.  Social History   Tobacco Use  . Smoking status: Never Smoker  . Smokeless tobacco: Never Used  Substance Use Topics  . Alcohol use: Yes  . Drug use: No    Home Medications Prior to Admission medications   Medication Sig Start Date End Date Taking? Authorizing Provider  hydrochlorothiazide (MICROZIDE) 12.5 MG capsule Take 12.5 mg by mouth daily.    [provider]  HYDROcodone-acetaminophen (NORCO/VICODIN) 5-325 MG tablet Take 1 tablet by mouth every 6 (six) hours as needed. 05/28/19   Vanetta Mulders, MD  ondansetron (ZOFRAN ODT) 4 MG disintegrating tablet Take 1 tablet (4 mg total) by mouth every 8 (eight) hours as needed. 05/28/19   Vanetta Mulders, MD    Allergies    Patient has no known allergies.  Review of Systems   Review of Systems  Respiratory: Negative for shortness of breath.   Cardiovascular: Negative for chest pain and palpitations.  Gastrointestinal: Negative for vomiting.    Neurological: Positive for light-headedness. Negative for syncope.  All other systems reviewed and are negative.   Physical Exam Updated Vital Signs BP 112/72   Pulse 63   Temp 98.5 F (36.9 C)   Resp 16   Ht 5\' 10"  (1.778 m)   Wt (!) 97.5 kg   SpO2 99%   BMI 30.85 kg/m   Physical Exam Vitals and nursing note reviewed.  Constitutional:      General: He is not in acute distress.    Appearance: He is well-developed. He is obese. He is not ill-appearing or diaphoretic.  HENT:     Head: Normocephalic and atraumatic.     Right Ear: External ear normal.     Left Ear: External ear normal.     Nose: Nose normal.  Eyes:     General:        Right eye: No discharge.        Left eye: No discharge.     Extraocular Movements: Extraocular movements intact.     Pupils: Pupils are equal, round, and reactive to light.  Cardiovascular:     Rate and Rhythm: Normal rate and regular rhythm.     Heart sounds: Normal heart sounds.  Pulmonary:     Effort: Pulmonary effort is normal.     Breath sounds: Normal breath sounds.  Abdominal:     Palpations: Abdomen is  soft.     Tenderness: There is no abdominal tenderness.  Musculoskeletal:     Cervical back: Neck supple.  Skin:    General: Skin is warm and dry.  Neurological:     Mental Status: He is alert.     Comments: CN 3-12 grossly intact. 5/5 strength in all 4 extremities. Grossly normal sensation. Normal finger to nose.   Psychiatric:        Mood and Affect: Mood is not anxious.     ED Results / Procedures / Treatments   Labs (all labs ordered are listed, but only abnormal results are displayed) Labs Reviewed  BASIC METABOLIC PANEL - Abnormal; Notable for the following components:      Result Value   CO2 20 (*)    Glucose, Bld 117 (*)    Creatinine, Ser 1.40 (*)    Calcium 8.7 (*)    GFR calc non Af Amer 52 (*)    GFR calc Af Amer 60 (*)    All other components within normal limits  CBG MONITORING, ED - Abnormal; Notable  for the following components:   Glucose-Capillary 128 (*)    All other components within normal limits  CBC WITH DIFFERENTIAL/PLATELET    EKG EKG Interpretation  Date/Time:  Sunday March 12 2020 19:05:21 EDT Ventricular Rate:  67 PR Interval:    QRS Duration: 112 QT Interval:  405 QTC Calculation: 428 R Axis:   69 Text Interpretation: Sinus rhythm Incomplete right bundle branch block No old tracing to compare Confirmed by Pricilla Loveless [phone]) on 03/12/2020 8:16:24 PM   Radiology No results found.  Procedures Procedures (including critical care time)  Medications Ordered in ED Medications  sodium chloride 0.9 % bolus 1,000 mL (0 mLs Intravenous Stopped 03/12/20 2118)    ED Course  I have reviewed the triage vital signs and the nursing notes.  Pertinent labs & imaging results that were available during my care of the patient were reviewed by me and considered in my medical decision making (see chart for details).    MDM Rules/Calculators/A&P                          The near syncope is most likely related to dehydration from a combination of being on HCTZ, being out in the heat, drinking alcohol and working.  He was given some fluids by EMS and then another liter here.  He appears to have a very mild AKI.  He is feeling much better and his blood pressure is better.  I have advised him to hold his HCTZ tomorrow and drink plenty of fluids and follow-up with PCP.  Doubt cardiac arrhythmia or other serious cause of near syncope. Final Clinical Impression(s) / ED Diagnoses Final diagnoses:  Near syncope  Dehydration    Rx / DC Orders ED Discharge Orders    None       Pricilla Loveless, MD 03/12/20 [phone]

## 2020-03-12 NOTE — Discharge Instructions (Signed)
Your blood pressure has been running a little low tonight from your dehydration. Do not take your hydrochlorothiazide tomorrow (8/2).

## 2020-03-12 NOTE — ED Triage Notes (Signed)
[facility] - pt had near syncopal episode, was nauseous and light headed after. Pt hypotensive with EMS.

## 2020-10-31 ENCOUNTER — Encounter (HOSPITAL_COMMUNITY): Payer: Self-pay | Admitting: Emergency Medicine

## 2020-10-31 ENCOUNTER — Emergency Department (HOSPITAL_COMMUNITY)
Admission: EM | Admit: 2020-10-31 | Discharge: 2020-10-31 | Disposition: A | Discharge status: Home / Self Care | Payer: Medicare HMO | Attending: Emergency Medicine | Admitting: Emergency Medicine

## 2020-10-31 ENCOUNTER — Other Ambulatory Visit: Payer: Self-pay

## 2020-10-31 ENCOUNTER — Emergency Department (HOSPITAL_COMMUNITY): Payer: Medicare HMO

## 2020-10-31 DIAGNOSIS — E119 Type 2 diabetes mellitus without complications: Secondary | ICD-10-CM | POA: Diagnosis not present

## 2020-10-31 DIAGNOSIS — S61012A Laceration without foreign body of left thumb without damage to nail, initial encounter: Secondary | ICD-10-CM

## 2020-10-31 DIAGNOSIS — Z23 Encounter for immunization: Secondary | ICD-10-CM | POA: Insufficient documentation

## 2020-10-31 DIAGNOSIS — W312XXA Contact with powered woodworking and forming machines, initial encounter: Secondary | ICD-10-CM | POA: Insufficient documentation

## 2020-10-31 DIAGNOSIS — Z79899 Other long term (current) drug therapy: Secondary | ICD-10-CM | POA: Insufficient documentation

## 2020-10-31 MED ORDER — LIDOCAINE HCL (PF) 1 % IJ SOLN
10.0000 mL | Freq: Once | INTRAMUSCULAR | Status: DC
  Filled 2020-10-31: qty 30

## 2020-10-31 MED ORDER — HYDROCODONE-ACETAMINOPHEN 5-325 MG PO TABS
1.0000 | ORAL_TABLET | Freq: Once | ORAL | Status: AC
  Administered 2020-10-31: 1 via ORAL
  Filled 2020-10-31: qty 1

## 2020-10-31 MED ORDER — TETANUS-DIPHTH-ACELL PERTUSSIS 5-2.5-18.5 LF-MCG/0.5 IM SUSY
0.5000 mL | PREFILLED_SYRINGE | Freq: Once | INTRAMUSCULAR | Status: AC
  Administered 2020-10-31: 0.5 mL via INTRAMUSCULAR
  Filled 2020-10-31: qty 0.5

## 2020-10-31 MED ORDER — CEPHALEXIN 500 MG PO CAPS: 500.0000 mg | ORAL_CAPSULE | Freq: Three times a day (TID) | ORAL | 0 refills | Status: AC

## 2020-10-31 NOTE — Discharge Instructions (Signed)
1. Medications: antibiotics to prevent infection, Tylenol or ibuprofen for pain, usual home medications 2. Treatment: ice for swelling, keep wound clean with warm soap and water and keep bandage dry 3. Follow Up: Please return in 10-14 days to have your stitches removed or sooner if you have concerns. Return to the emergency department for increased redness, drainage of pus from the wound   WOUND CARE  Remove bandage and wash wound gently with mild soap and warm water daily.  Continue daily cleansing with soap and water until stitches are removed.  Do not apply any ointments or creams to the wound while stitches are in place, as this may cause delayed healing. Return if you experience any of the following signs of infection: Swelling, redness, pus drainage, streaking, fever >101.0 F  Return if you experience excessive bleeding that does not stop after 15-20 minutes of constant, firm pressure.

## 2020-10-31 NOTE — ED Triage Notes (Signed)
Pt to the ED with a laceration to his left thumb after cutting it with a skill saw.   Bleeding controlled.

## 2020-10-31 NOTE — ED Provider Notes (Signed)
[facility] EMERGENCY DEPARTMENT Provider Note   CSN: [id number] Arrival date & time: 10/31/20  1453     History Chief Complaint  Patient presents with  . Laceration    RAFEL [place] is a 70 y.o. male presenting for evaluation of left thumb laceration.  Patient states just prior to arrival he was using a table saw when it slipped and he cut his left thumb.  He reports acute onset pain.  No injury elsewhere.  He denies numbness.  Pain does not radiate.  He makes better worse.  He has not taken anything for pain.  He is not on blood thinners, he is immunocompetent.    HPI     Past Medical History:  Diagnosis Date  . Diabetes mellitus without complication (HCC)     There are no problems to display for this patient.   Past Surgical History:  Procedure Laterality Date  . ABDOMINAL SURGERY    . FOOT SURGERY    . THROAT SURGERY         History reviewed. No pertinent family history.  Social History   Tobacco Use  . Smoking status: Never Smoker  . Smokeless tobacco: Never Used  Vaping Use  . Vaping Use: Never used  Substance Use Topics  . Alcohol use: Yes  . Drug use: No    Home Medications Prior to Admission medications   Medication Sig Start Date End Date Taking? Authorizing Provider  cephALEXin (KEFLEX) 500 MG capsule Take 1 capsule (500 mg total) by mouth 3 (three) times daily for 5 days. 10/31/20 11/05/20 Yes Caccavale, Sophia, PA-C  hydrochlorothiazide (MICROZIDE) 12.5 MG capsule Take 12.5 mg by mouth daily.    [provider]  HYDROcodone-acetaminophen (NORCO/VICODIN) 5-325 MG tablet Take 1 tablet by mouth every 6 (six) hours as needed. 05/28/19   Vanetta Mulders, MD  ondansetron (ZOFRAN ODT) 4 MG disintegrating tablet Take 1 tablet (4 mg total) by mouth every 8 (eight) hours as needed. 05/28/19   Vanetta Mulders, MD    Allergies    Patient has no known allergies.  Review of Systems   Review of Systems  Skin: Positive for wound.   Neurological: Negative for numbness.    Physical Exam Updated Vital Signs BP 132/86 (BP Location: Right Arm)   Pulse 84   Temp 98 F (36.7 C) (Oral)   Resp 20   Ht 5\' 10"  (1.778 m)   Wt 100.7 kg   SpO2 96%   BMI 31.85 kg/m   Physical Exam Vitals and nursing note reviewed.  Constitutional:      General: He is not in acute distress.    Appearance: He is well-developed.  HENT:     Head: Normocephalic and atraumatic.  Pulmonary:     Effort: Pulmonary effort is normal.  Abdominal:     General: There is no distension.  Musculoskeletal:        General: Tenderness and signs of injury present. Normal range of motion.       Hands:     Cervical back: Normal range of motion.     Comments: 3 cm laceration of the left thumb on the volar aspect.  Approximately 0.5 cm deep.  Minimal active bleeding. Full active range of motion of the left thumb at each joint when held in isolation.  Good distal sensation and cap refill. No nail bed involvement  Skin:    General: Skin is warm.     Capillary Refill: Capillary refill takes less than 2 seconds.  Findings: No rash.  Neurological:     Mental Status: He is alert and oriented to person, place, and time.     ED Results / Procedures / Treatments   Labs (all labs ordered are listed, but only abnormal results are displayed) Labs Reviewed - No data to display  EKG None  Radiology DG Finger Thumb Left  Result Date: 10/31/2020 CLINICAL DATA:  Table saw injury with laceration in the thumb EXAM: LEFT THUMB 2+V COMPARISON:  None. FINDINGS: Soft tissue injury is noted consistent with the given clinical history. Again tissue Karie Mainland there is a bony defect in the first distal phalanx consistent with the saw injury. This does not extend into the interphalangeal joint. No radiopaque foreign bodies are seen. IMPRESSION: Soft tissue and bony injury consistent with the given clinical history. No radiopaque foreign body is noted. Electronically Signed    By: Alcide Clever M.D.   On: 10/31/2020 16:12    Procedures .Marland KitchenLaceration Repair  Date/Time: 10/31/2020 5:21 PM Performed by: Alveria Apley, PA-C Authorized by: Alveria Apley, PA-C   Consent:    Consent obtained:  Verbal   Consent given by:  Patient   Risks discussed:  Infection, need for additional repair, nerve damage, poor wound healing, poor cosmetic result and pain Anesthesia:    Anesthesia method:  Nerve block   Block location:  L thumb   Block needle gauge:  25 G   Block anesthetic:  Lidocaine 1% w/o epi   Block technique:  Ring block   Block injection procedure:  Anatomic landmarks identified, anatomic landmarks palpated, introduced needle, negative aspiration for blood and incremental injection   Block outcome:  Anesthesia achieved Laceration details:    Location:  Finger   Finger location:  L thumb   Length (cm):  3   Depth (mm):  5 Pre-procedure details:    Preparation:  Patient was prepped and draped in usual sterile fashion and imaging obtained to evaluate for foreign bodies Exploration:    Hemostasis achieved with:  Direct pressure   Wound exploration: wound explored through full range of motion and entire depth of wound visualized     Wound extent: no nerve damage noted, no tendon damage noted and no underlying fracture noted   Treatment:    Area cleansed with:  Saline   Amount of cleaning:  Standard   Irrigation solution:  Sterile water   Irrigation method:  Syringe Skin repair:    Repair method:  Sutures   Suture size:  4-0   Suture material:  Prolene   Suture technique:  Simple interrupted   Number of sutures:  7 Approximation:    Approximation:  Close Repair type:    Repair type:  Simple Post-procedure details:    Dressing:  Sterile dressing   Procedure completion:  Tolerated well, no immediate complications     Medications Ordered in ED Medications  lidocaine (PF) (XYLOCAINE) 1 % injection 10 mL (has no administration in time range)   Tdap (BOOSTRIX) injection 0.5 mL (0.5 mLs Intramuscular Given 10/31/20 1530)  HYDROcodone-acetaminophen (NORCO/VICODIN) 5-325 MG per tablet 1 tablet (1 tablet Oral Given 10/31/20 1529)    ED Course  I have reviewed the triage vital signs and the nursing notes.  Pertinent labs & imaging results that were available during my care of the patient were reviewed by me and considered in my medical decision making (see chart for details).    MDM Rules/Calculators/A&P  Patient presenting for evaluation of left thumb laceration.  On exam, patient appears nontoxic.  He is neurovascularly intact.  Tetanus updated in the ED.  Due to depth, x-ray obtained.  X-ray viewed interpreted by me, no fracture or dislocation.  Laceration repair described above.  Discussed aftercare instructions.  At this time, patient appears safe for discharge.  Return precautions given.  Patient states he understands and agrees to plan.  Final Clinical Impression(s) / ED Diagnoses Final diagnoses:  Laceration of left thumb without foreign body without damage to nail, initial encounter    Rx / DC Orders ED Discharge Orders         Ordered    cephALEXin (KEFLEX) 500 MG capsule  3 times daily        10/31/20 1726           Caccavale, Sophia, PA-C 10/31/20 1727    Mancel Bale, MD 11/02/20 [phone]

## 2021-03-12 ENCOUNTER — Other Ambulatory Visit: Payer: Self-pay

## 2021-03-12 ENCOUNTER — Emergency Department (HOSPITAL_COMMUNITY)
Admission: EM | Admit: 2021-03-12 | Discharge: 2021-03-12 | Disposition: A | Discharge status: Home / Self Care | Payer: Medicare HMO | Attending: Emergency Medicine | Admitting: Emergency Medicine

## 2021-03-12 ENCOUNTER — Emergency Department (HOSPITAL_COMMUNITY): Payer: Medicare HMO

## 2021-03-12 ENCOUNTER — Encounter (HOSPITAL_COMMUNITY): Payer: Self-pay | Admitting: Emergency Medicine

## 2021-03-12 DIAGNOSIS — E119 Type 2 diabetes mellitus without complications: Secondary | ICD-10-CM | POA: Diagnosis not present

## 2021-03-12 DIAGNOSIS — R109 Unspecified abdominal pain: Secondary | ICD-10-CM

## 2021-03-12 DIAGNOSIS — K8020 Calculus of gallbladder without cholecystitis without obstruction: Secondary | ICD-10-CM | POA: Insufficient documentation

## 2021-03-12 LAB — URINALYSIS, ROUTINE W REFLEX MICROSCOPIC
Bilirubin Urine: NEGATIVE
Glucose, UA: NEGATIVE mg/dL
Hgb urine dipstick: NEGATIVE
Ketones, ur: 5 mg/dL — AB
Leukocytes,Ua: NEGATIVE
Nitrite: NEGATIVE
Protein, ur: NEGATIVE mg/dL
Specific Gravity, Urine: 1.027 (ref 1.005–1.030)
pH: 5 (ref 5.0–8.0)

## 2021-03-12 LAB — COMPREHENSIVE METABOLIC PANEL
ALT: 10 U/L (ref 0–44)
AST: 14 U/L — ABNORMAL LOW (ref 15–41)
Albumin: 4 g/dL (ref 3.5–5.0)
Alkaline Phosphatase: 67 U/L (ref 38–126)
Anion gap: 5 (ref 5–15)
BUN: 13 mg/dL (ref 8–23)
CO2: 25 mmol/L (ref 22–32)
Calcium: 8.7 mg/dL — ABNORMAL LOW (ref 8.9–10.3)
Chloride: 105 mmol/L (ref 98–111)
Creatinine, Ser: 1.06 mg/dL (ref 0.61–1.24)
GFR, Estimated: >60 mL/min (ref >60)
Glucose, Bld: 189 mg/dL — ABNORMAL HIGH (ref 70–99)
Potassium: 4.2 mmol/L (ref 3.5–5.1)
Sodium: 135 mmol/L (ref 135–145)
Total Bilirubin: 0.4 mg/dL (ref 0.3–1.2)
Total Protein: 7.4 g/dL (ref 6.5–8.1)

## 2021-03-12 LAB — CBC WITH DIFFERENTIAL/PLATELET
Abs Immature Granulocytes: 0.02 10*3/uL (ref 0.00–0.07)
Basophils Absolute: 0.1 10*3/uL (ref 0.0–0.1)
Basophils Relative: 1 %
Eosinophils Absolute: 0.2 10*3/uL (ref 0.0–0.5)
Eosinophils Relative: 2 %
HCT: 37.4 % — ABNORMAL LOW (ref 39.0–52.0)
Hemoglobin: 12 g/dL — ABNORMAL LOW (ref 13.0–17.0)
Immature Granulocytes: 0 %
Lymphocytes Relative: 20 %
Lymphs Abs: 2.1 10*3/uL (ref 0.7–4.0)
MCH: 28.8 pg (ref 26.0–34.0)
MCHC: 32.1 g/dL (ref 30.0–36.0)
MCV: 89.7 fL (ref 80.0–100.0)
Monocytes Absolute: 0.6 10*3/uL (ref 0.1–1.0)
Monocytes Relative: 6 %
Neutro Abs: 7.9 10*3/uL — ABNORMAL HIGH (ref 1.7–7.7)
Neutrophils Relative %: 71 %
Platelets: 165 10*3/uL (ref 150–400)
RBC: 4.17 MIL/uL — ABNORMAL LOW (ref 4.22–5.81)
RDW: 14.4 % (ref 11.5–15.5)
WBC: 10.9 10*3/uL — ABNORMAL HIGH (ref 4.0–10.5)
nRBC: 0 % (ref 0.0–0.2)

## 2021-03-12 LAB — TROPONIN I (HIGH SENSITIVITY)
Troponin I (High Sensitivity): 4 ng/L (ref <18)
Troponin I (High Sensitivity): 5 ng/L (ref <18)
Troponin I (High Sensitivity): 4 ng/L (ref <18)

## 2021-03-12 LAB — LIPASE, BLOOD: Lipase: 24 U/L (ref 11–51)

## 2021-03-12 MED ORDER — KETOROLAC TROMETHAMINE 30 MG/ML IJ SOLN
15.0000 mg | Freq: Once | INTRAMUSCULAR | Status: AC
  Administered 2021-03-12: 15 mg via INTRAVENOUS
  Filled 2021-03-12: qty 1

## 2021-03-12 MED ORDER — DICLOFENAC SODIUM 1 % EX GEL: 2.0000 g | Freq: Four times a day (QID) | CUTANEOUS | 0 refills | Status: DC | PRN

## 2021-03-12 MED ORDER — HYDROCODONE-ACETAMINOPHEN 5-325 MG PO TABS: 1.0000 | ORAL_TABLET | Freq: Four times a day (QID) | ORAL | 0 refills | Status: DC | PRN

## 2021-03-12 MED ORDER — OXYCODONE-ACETAMINOPHEN 5-325 MG PO TABS
1.0000 | ORAL_TABLET | Freq: Once | ORAL | Status: AC
  Administered 2021-03-12: 1 via ORAL
  Filled 2021-03-12: qty 1

## 2021-03-12 NOTE — Discharge Instructions (Addendum)

## 2021-03-12 NOTE — ED Notes (Signed)
Pt in radiology 

## 2021-03-12 NOTE — ED Triage Notes (Signed)
Pt c/o right sided flank pain that started around 2 oclock today. Pain started after he states he ate a cucumber. He states he has history of kidney stones. [facility] gave 100 mcg fentanyl IV.

## 2021-03-12 NOTE — ED Provider Notes (Signed)
Emergency Department Provider Note   I have reviewed the triage vital signs and the nursing notes.   HISTORY  Chief Complaint Flank Pain   HPI Samuel Washington is a 70 y.o. male with PMH of DM and remote history of kidney stone presents to the ED with acute onset right flank pain. Patient reports severe pain in the right flank radiating to the back. Had some pain radiate into the chest. He was given Fentanyl with EMS en route and reports decreased pain but still feels like he is having trouble getting comfortable. No fever, chills, or UTI symptoms. No diarrhea. No SOB. Unsure if this feels like prior kidney stones or not.    Past Medical History:  Diagnosis Date   Diabetes mellitus without complication (HCC)     There are no problems to display for this patient.   Past Surgical History:  Procedure Laterality Date   ABDOMINAL SURGERY     FOOT SURGERY     THROAT SURGERY      Allergies Patient has no known allergies.  History reviewed. No pertinent family history.  Social History Social History   Tobacco Use   Smoking status: Never   Smokeless tobacco: Never  Vaping Use   Vaping Use: Never used  Substance Use Topics   Alcohol use: Yes   Drug use: No    Review of Systems  Constitutional: No fever/chills Eyes: No visual changes. ENT: No sore throat. Cardiovascular: Positive chest pain. Respiratory: Denies shortness of breath. Gastrointestinal: Positive right flank/abdominal pain.  No nausea, no vomiting.  No diarrhea.  No constipation. Genitourinary: Negative for dysuria. Musculoskeletal: Negative for back pain. Skin: Negative for rash. Neurological: Negative for headaches, focal weakness or numbness.  10-point ROS otherwise negative.  ____________________________________________   PHYSICAL EXAM:  VITAL SIGNS: ED Triage Vitals  Enc Vitals Group     BP 03/12/21 1735 (!) 126/58     Pulse Rate 03/12/21 1735 62     Resp 03/12/21 1735 16     Temp  03/12/21 1735 (!) 97.5 F (36.4 C)     Temp Source 03/12/21 1735 Oral     SpO2 03/12/21 1735 94 %     Weight 03/12/21 1736 218 lb (98.9 kg)     Height 03/12/21 1736 5\' 10"  (1.778 m)   Constitutional: Alert and oriented. Well appearing and in no acute distress. Eyes: Conjunctivae are normal. Head: Atraumatic. Nose: No congestion/rhinnorhea. Mouth/Throat: Mucous membranes are moist.   Neck: No stridor.  Cardiovascular: Normal rate, regular rhythm. Good peripheral circulation. Grossly normal heart sounds.   Respiratory: Normal respiratory effort.  No retractions. Lungs CTAB. Gastrointestinal: Soft and nontender. No focal RUQ tenderness or epigastric discomfort. No distention.  Musculoskeletal: No lower extremity tenderness nor edema. No gross deformities of extremities. No chest wall tenderness.  Neurologic:  Normal speech and language. No gross focal neurologic deficits are appreciated.  Skin:  Skin is warm, dry and intact. No rash noted.   ____________________________________________   LABS (all labs ordered are listed, but only abnormal results are displayed)  Labs Reviewed  COMPREHENSIVE METABOLIC PANEL - Abnormal; Notable for the following components:      Result Value   Glucose, Bld 189 (*)    Calcium 8.7 (*)    AST 14 (*)    All other components within normal limits  CBC WITH DIFFERENTIAL/PLATELET - Abnormal; Notable for the following components:   WBC 10.9 (*)    RBC 4.17 (*)    Hemoglobin 12.0 (*)  HCT 37.4 (*)    Neutro Abs 7.9 (*)    All other components within normal limits  URINALYSIS, ROUTINE W REFLEX MICROSCOPIC - Abnormal; Notable for the following components:   Ketones, ur 5 (*)    All other components within normal limits  URINE CULTURE  LIPASE, BLOOD  TROPONIN I (HIGH SENSITIVITY)  TROPONIN I (HIGH SENSITIVITY)  TROPONIN I (HIGH SENSITIVITY)   ____________________________________________  EKG   EKG Interpretation  Date/Time:  Monday March 12 2021 18:09:28 EDT Ventricular Rate:  62 PR Interval:  159 QRS Duration: 103 QT Interval:  412 QTC Calculation: 419 R Axis:   67 Text Interpretation: Sinus rhythm Confirmed by Alona Bene [phone]) on 03/12/2021 11:37:06 PM        ____________________________________________  RADIOLOGY  DG Chest 2 View  Result Date: 03/12/2021 CLINICAL DATA:  Chest pain. Right flank pain today. History of kidney stones. EXAM: CHEST - 2 VIEW COMPARISON:  None. FINDINGS: The heart size and mediastinal contours are normal. Mild central airway thickening and scarring at both lung bases. The lungs are otherwise clear. There is no pleural effusion or pneumothorax. The bones appear unremarkable. Telemetry leads overlie the chest. IMPRESSION: Mild chronic-appearing central airway thickening and basilar scarring. No acute cardiopulmonary process. Electronically Signed   By: Carey Bullocks M.D.   On: 03/12/2021 19:22   CT Renal Stone Study  Result Date: 03/12/2021 CLINICAL DATA:  Right flank pain.  History of kidney stones. EXAM: CT ABDOMEN AND PELVIS WITHOUT CONTRAST TECHNIQUE: Multidetector CT imaging of the abdomen and pelvis was performed following the standard protocol without IV contrast. COMPARISON:  Abdominopelvic CT 05/28/2019. FINDINGS: Lower chest: Stable mild chronic scarring at both lung bases. No significant pleural or pericardial effusion. Hepatobiliary: The liver appears stable without focal abnormality on noncontrast imaging. A partially calcified gallstone is again noted. Stable mild extrahepatic biliary prominence with the common hepatic duct measuring 8 mm in diameter. No evidence of gallbladder wall thickening or surrounding inflammation. Pancreas: Unremarkable. No pancreatic ductal dilatation or surrounding inflammatory changes. Spleen: Normal in size without focal abnormality. Adrenals/Urinary Tract: Both adrenal glands appear normal. Both kidneys appear unremarkable. No evidence of urinary tract  calculus, hydronephrosis or perinephric soft tissue stranding. The bladder is poorly distended and suboptimally evaluated. Possible mild bladder wall thickening. Stomach/Bowel: No enteric contrast administered. The stomach appears unremarkable for its degree of distension. No evidence of bowel wall thickening, distention or surrounding inflammatory change. Suspected previous appendectomy. Mild diverticular changes in the descending and sigmoid colon. Vascular/Lymphatic: There are no enlarged abdominal or pelvic lymph nodes. Aortic and branch vessel atherosclerosis without aneurysm. Reproductive: The prostate gland and seminal vesicles appear unremarkable. Other: No evidence of abdominal wall mass or hernia. No ascites. Musculoskeletal: No acute or significant osseous findings. Previous L5-S1 fusion and mild lumbar spondylosis. IMPRESSION: 1. No evidence of urinary tract calculus or hydronephrosis. 2. Limited bladder assessment due to incomplete distension. Possible mild bladder wall thickening. Correlate with urine analysis. 3. Stable mild chronic extrahepatic biliary prominence and cholelithiasis. No evidence of gallbladder wall thickening. 4. Mild distal colonic diverticulosis. 5.  Aortic Atherosclerosis (ICD10-I70.0). Electronically Signed   By: Carey Bullocks M.D.   On: 03/12/2021 19:32    ____________________________________________   PROCEDURES  Procedure(s) performed:   Procedures  None  ____________________________________________   INITIAL IMPRESSION / ASSESSMENT AND PLAN / ED COURSE  Pertinent labs & imaging results that were available during my care of the patient were reviewed by me and considered in my medical  decision making (see chart for details).   Patient presents to the ED with right flank pain. Clinically seems most consistent with kidney stone. Considered aortic pathology but with right sided pain have lower suspicion for this. Some pain into the chest. Will send EKG and  troponin. CT renal ordered and pending along with labs and CXR.   Delta troponin is normal.  Patient's lab work is reassuring.  Did not see evidence of a urinary tract infection.  CT scan does not show ureteral stone or secondary evidence of this.  Patient has stable cholelithiasis.  On exam, patient has no focal right upper quadrant tenderness to suspect symptomatic cholelithiasis.  Considered atypical ACS but with patient's negative troponins and reassuring EKG have very low suspicion for this clinically.  Plan for treatment of pain symptoms at home with close PCP follow-up.  [place] drug database reviewed prior to Rx. Discussed strict ED return precautions.  ____________________________________________  FINAL CLINICAL IMPRESSION(S) / ED DIAGNOSES  Final diagnoses:  Right flank pain     MEDICATIONS GIVEN DURING THIS VISIT:  Medications  ketorolac (TORADOL) 30 MG/ML injection 15 mg (15 mg Intravenous Given 03/12/21 1805)  oxyCODONE-acetaminophen (PERCOCET/ROXICET) 5-325 MG per tablet 1 tablet (1 tablet Oral Given 03/12/21 2330)     NEW OUTPATIENT MEDICATIONS STARTED DURING THIS VISIT:  New Prescriptions   DICLOFENAC SODIUM (VOLTAREN) 1 % GEL    Apply 2 g topically 4 (four) times daily as needed.   HYDROCODONE-ACETAMINOPHEN (NORCO/VICODIN) 5-325 MG TABLET    Take 1 tablet by mouth every 6 (six) hours as needed for severe pain.    Note:  This document was prepared using Dragon voice recognition software and may include unintentional dictation errors.  Alona Bene, MD, [facility]    Tanesha Arambula, Arlyss Repress, MD 03/12/21 [phone]

## 2021-03-14 LAB — URINE CULTURE: Culture: NO GROWTH

## 2022-05-21 IMAGING — CT CT RENAL STONE PROTOCOL
2 of 4 series · 16 of 46 positions shown, 18 images · non-contrast
Comparison: Abdominopelvic CT 05/28/2019.

CLINICAL DATA: Right flank pain.  History of kidney stones.

EXAM:
CT ABDOMEN AND PELVIS WITHOUT CONTRAST
TECHNIQUE: Multidetector CT imaging of the abdomen and pelvis was performed
following the standard protocol without IV contrast.

[Series 2: axial st · axial · 0.81mm/px · z∈[-505,-75]mm · 13 of 100 slices shown, 15 images]
[im 7/100  soft-tissue]
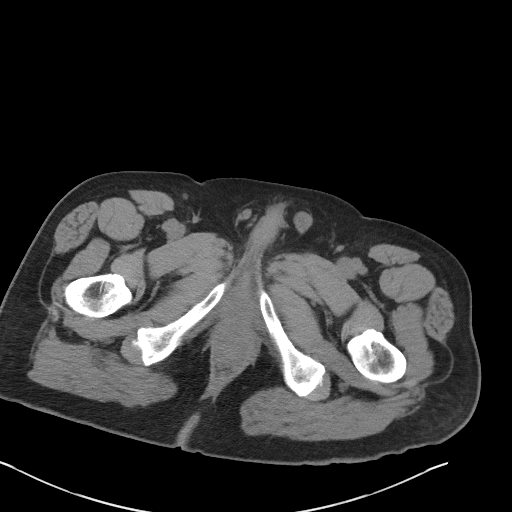
[im 7/100  bone]
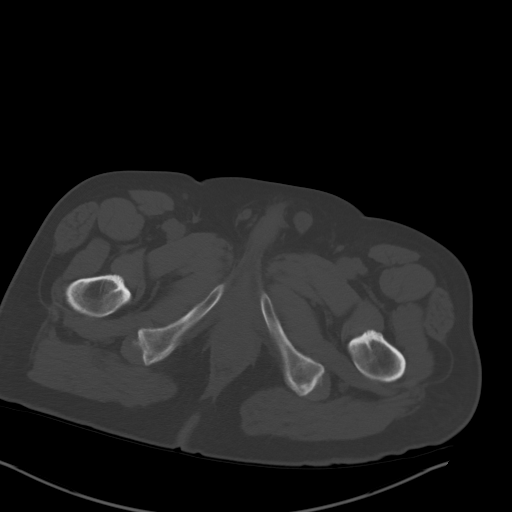
[im 14/100  soft-tissue]
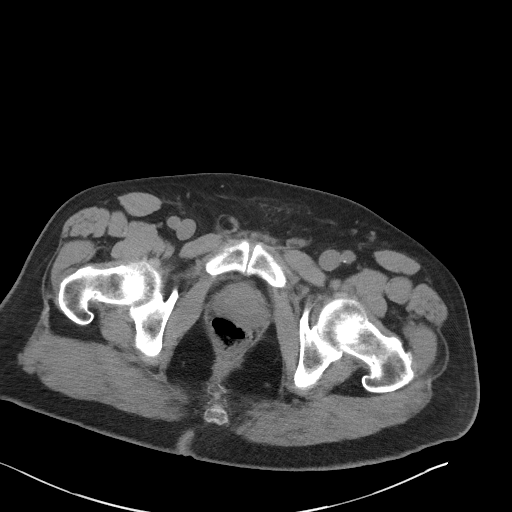
[im 20/100  soft-tissue]
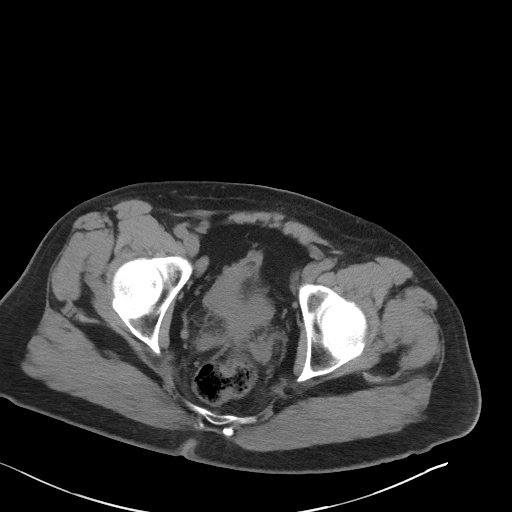
[im 27/100  soft-tissue]
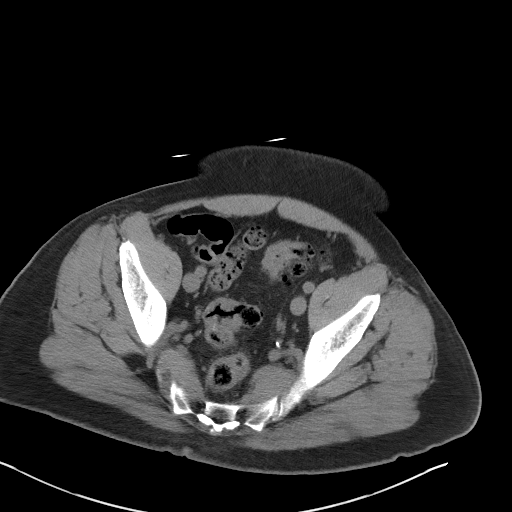
[im 34/100  soft-tissue]
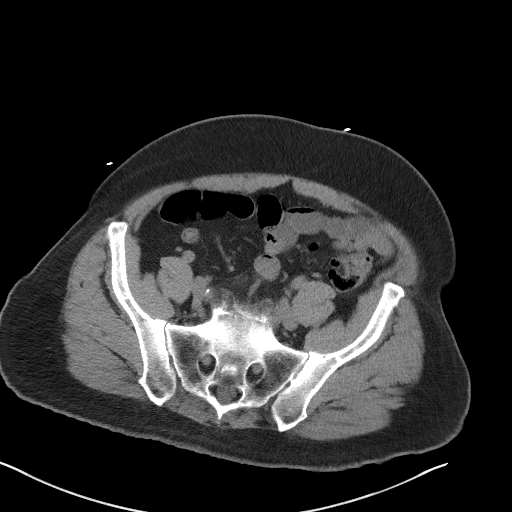
[im 40/100  soft-tissue]
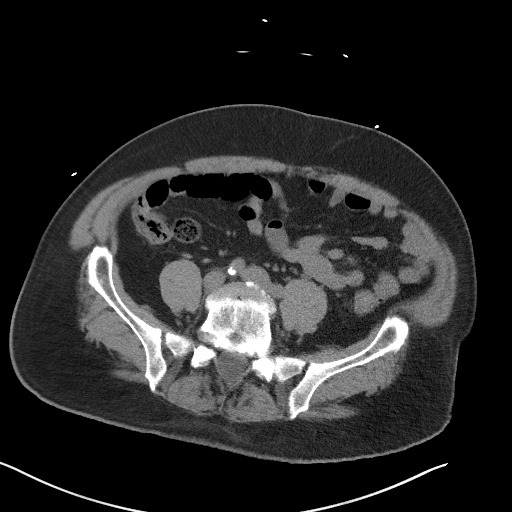
[im 53/100  soft-tissue]
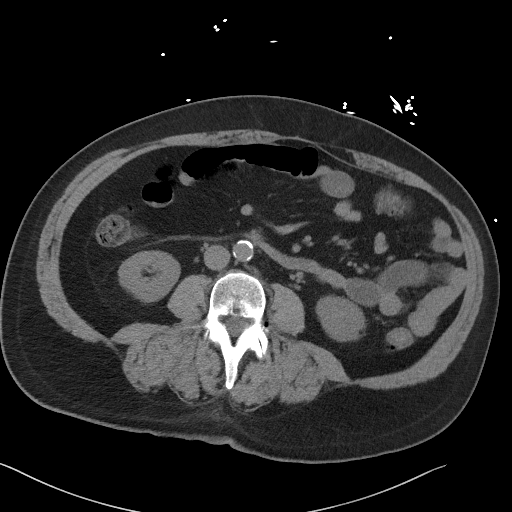
[im 60/100  soft-tissue]
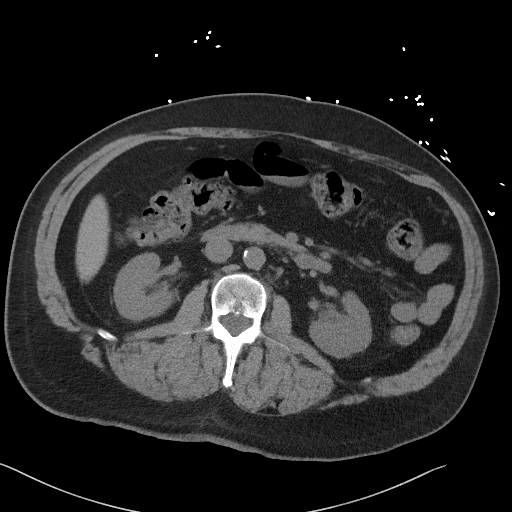
[im 67/100  soft-tissue]
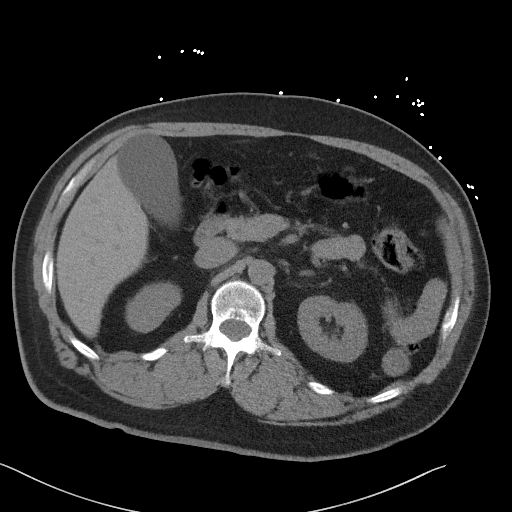
[im 67/100  bone]
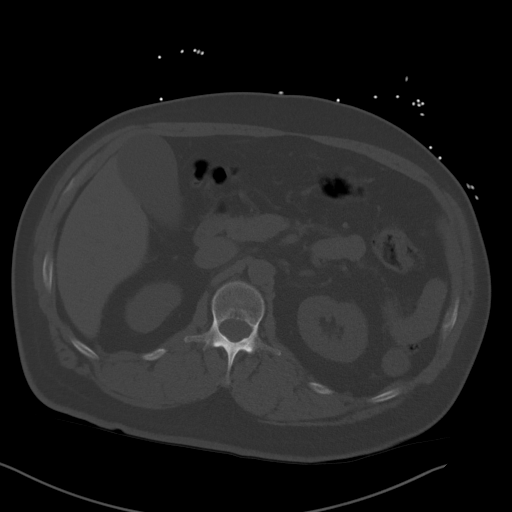
[im 73/100  soft-tissue]
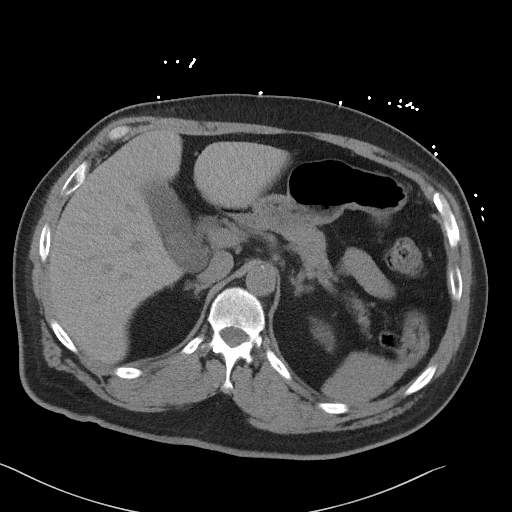
[im 80/100  soft-tissue]
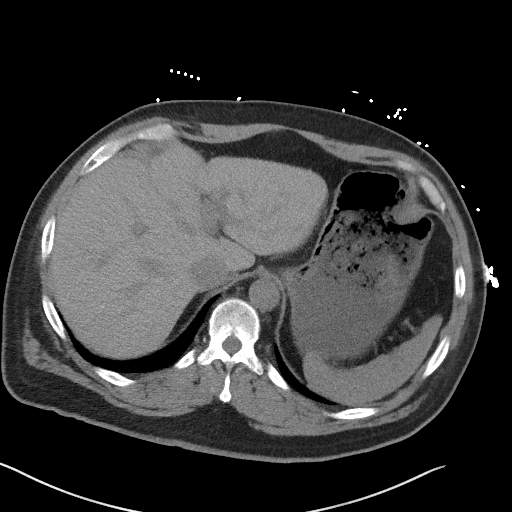
[im 86/100  soft-tissue]
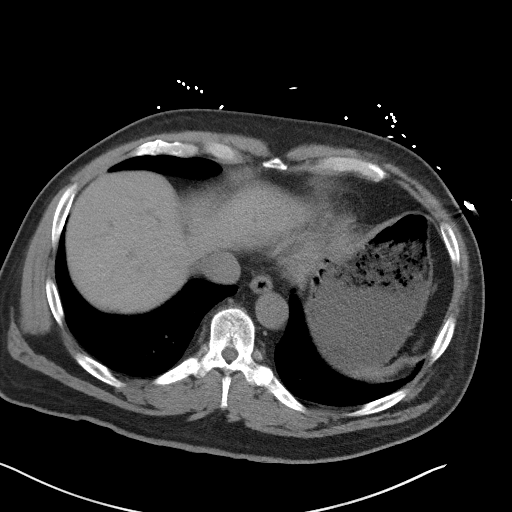
[im 93/100  soft-tissue]
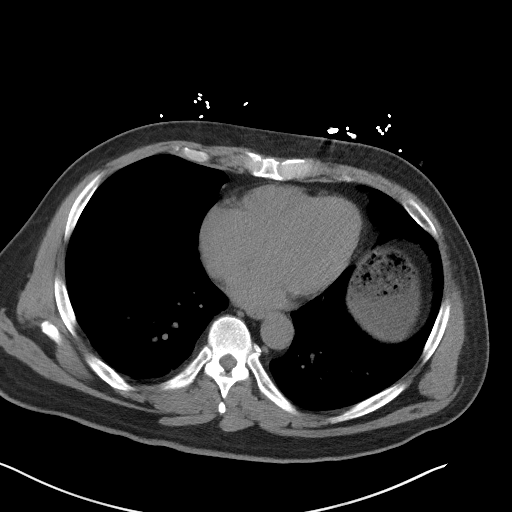

[Series 5: coronal st · coronal · 0.84mm/px · 3 of 101 slices shown]
[im 34/101  soft-tissue]
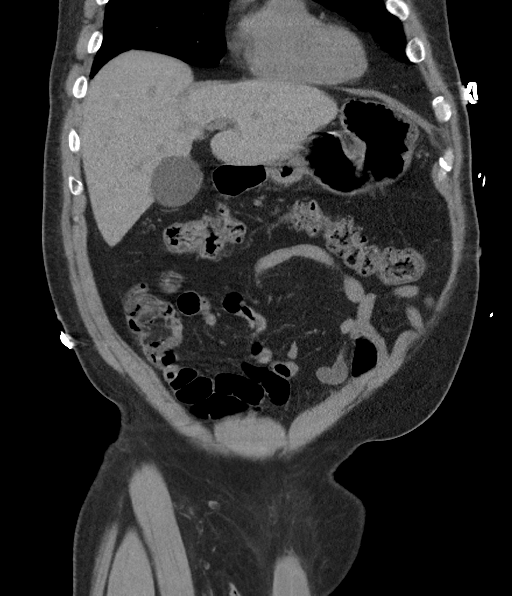
[im 45/101  soft-tissue]
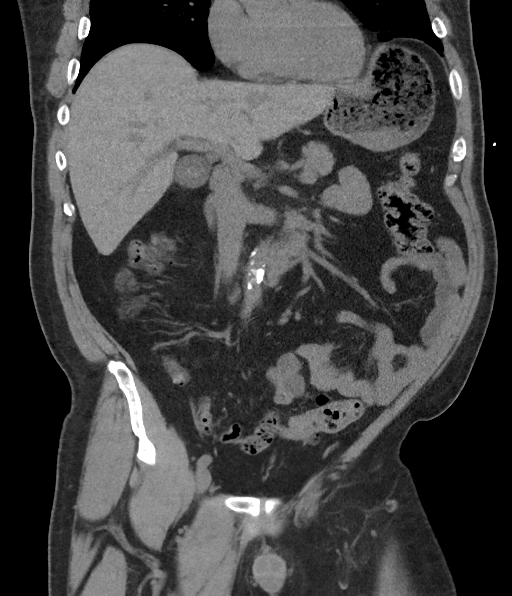
[im 56/101  soft-tissue]
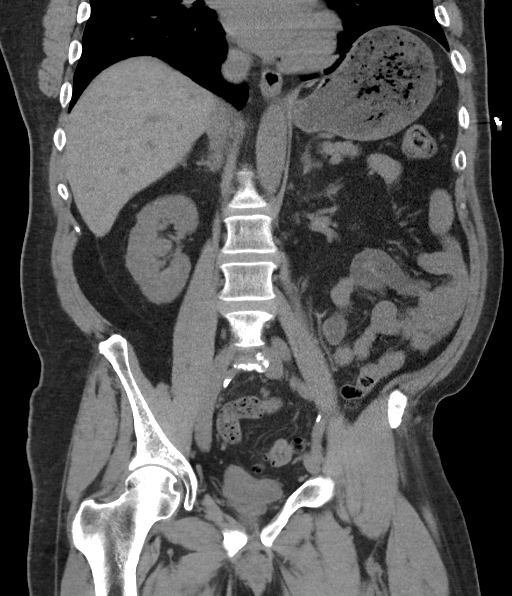

[16 of 46 positions shown; findings below may reference images not displayed]

FINDINGS: Lower chest: Stable mild chronic scarring at both lung bases. No
significant pleural or pericardial effusion.

Hepatobiliary: The liver appears stable without focal abnormality on
noncontrast imaging. A partially calcified gallstone is again noted.
Stable mild extrahepatic biliary prominence with the common hepatic
duct measuring 8 mm in diameter. No evidence of gallbladder wall
thickening or surrounding inflammation.

Pancreas: Unremarkable. No pancreatic ductal dilatation or
surrounding inflammatory changes.

Spleen: Normal in size without focal abnormality.

Adrenals/Urinary Tract: Both adrenal glands appear normal. Both
kidneys appear unremarkable. No evidence of urinary tract calculus,
hydronephrosis or perinephric soft tissue stranding. The bladder is
poorly distended and suboptimally evaluated. Possible mild bladder
wall thickening.

Stomach/Bowel: No enteric contrast administered. The stomach appears
unremarkable for its degree of distension. No evidence of bowel wall
thickening, distention or surrounding inflammatory change. Suspected
previous appendectomy. Mild diverticular changes in the descending
and sigmoid colon.

Vascular/Lymphatic: There are no enlarged abdominal or pelvic lymph
nodes. Aortic and branch vessel atherosclerosis without aneurysm.

Reproductive: The prostate gland and seminal vesicles appear
unremarkable.

Other: No evidence of abdominal wall mass or hernia. No ascites.

Musculoskeletal: No acute or significant osseous findings. Previous
L5-S1 fusion and mild lumbar spondylosis.
IMPRESSION: 1. No evidence of urinary tract calculus or hydronephrosis.
2. Limited bladder assessment due to incomplete distension. Possible
mild bladder wall thickening. Correlate with urine analysis.
3. Stable mild chronic extrahepatic biliary prominence and
cholelithiasis. No evidence of gallbladder wall thickening.
4. Mild distal colonic diverticulosis.
5.  Aortic Atherosclerosis ([id number]).

## 2023-04-15 ENCOUNTER — Encounter: Payer: Self-pay | Admitting: *Deleted

## 2023-05-20 ENCOUNTER — Telehealth: Payer: Self-pay | Admitting: Internal Medicine

## 2023-05-20 NOTE — Telephone Encounter (Signed)
Received questionnaire. I need to retrieve his last colonoscopy and then it will go to review.

## 2023-05-22 NOTE — Telephone Encounter (Signed)
I spoke to patient and his wife. They do not remember who or when he had his last colonoscopy done. I checked with his PCP and they do not have a copy of it. All they know is it was done in [place].

## 2023-07-07 ENCOUNTER — Telehealth: Payer: Self-pay | Admitting: *Deleted

## 2023-07-07 NOTE — Telephone Encounter (Signed)
  Procedure: colonoscopy  Height: 5'10" Weight: 224 lb        Have you had a colonoscopy before?  Yes, pt doesn't know who or when just that it was in [place].  Do you have family history of colon cancer?  no  Do you have a family history of polyps? no  Previous colonoscopy with polyps removed? no  Do you have a history colorectal cancer?   no  Are you diabetic?  Yes, type 1  Do you have a prosthetic or mechanical heart valve? no  Do you have a pacemaker/defibrillator?   no  Have you had endocarditis/atrial fibrillation?  no  Do you use supplemental oxygen/CPAP?  no  Have you had joint replacement within the last 12 months?  no  Do you tend to be constipated or have to use laxatives?  no   Do you have history of alcohol use? If yes, how much and how often.  no  Do you have history or are you using drugs? If yes, what do are you  using?  no  Have you ever had a stroke/heart attack?  no  Have you ever had a heart or other vascular stent placed,?no  Do you take weight loss medication? no  Do you take any blood-thinning medications such as: (Plavix, aspirin, Coumadin, Aggrenox, Brilinta, Xarelto, Eliquis, Pradaxa, Savaysa or Effient)? no  If yes we need the name, milligram, dosage and who is prescribing doctor:  n/a             Current Outpatient Medications  Medication Sig Dispense Refill   gabapentin (NEURONTIN) 300 MG capsule Take 300 mg by mouth 3 (three) times daily.     lisinopril (ZESTRIL) 5 MG tablet Take 5 mg by mouth daily.     tamsulosin (FLOMAX) 0.4 MG CAPS capsule Take 0.4 mg by mouth daily.     traMADol (ULTRAM) 50 MG tablet Take 50 mg by mouth 3 (three) times daily as needed.     No current facility-administered medications for this visit.    No Known Allergies

## 2023-09-03 ENCOUNTER — Other Ambulatory Visit: Payer: Self-pay | Admitting: *Deleted

## 2023-09-03 ENCOUNTER — Encounter: Payer: Self-pay | Admitting: *Deleted

## 2023-09-03 MED ORDER — PEG 3350-KCL-NA BICARB-NACL 420 G PO SOLR: 4000.0000 mL | Freq: Once | ORAL | 0 refills | Status: AC

## 2023-09-03 NOTE — Telephone Encounter (Signed)
[employer] PA:  Approved Authorization #[id number]  Tracking [id number] Dates of service 09/19/2023 - 12/15/2023

## 2023-09-03 NOTE — Addendum Note (Signed)
Addended by: Elinor Dodge on: 09/03/2023 02:21 PM   Modules accepted: Orders

## 2023-09-03 NOTE — Telephone Encounter (Signed)
Can we clarify if he is taking anything for his diabetes?

## 2023-09-03 NOTE — Telephone Encounter (Signed)
Ok to schedule. ASA 2. Day of prep: am dose of metformin only.

## 2023-09-03 NOTE — Telephone Encounter (Signed)
Pt has been scheduled for 09/19/23 with Dr.Carver. Instructions mailed and prep sent to the pharmacy.

## 2023-09-03 NOTE — Telephone Encounter (Signed)
Pt is taking Metformin 1000 mg twice a day. I have added it to his medication list.

## 2023-09-05 ENCOUNTER — Encounter (INDEPENDENT_AMBULATORY_CARE_PROVIDER_SITE_OTHER): Payer: Self-pay | Admitting: *Deleted

## 2023-09-05 NOTE — Telephone Encounter (Signed)
Referral completed, [facility] apt letter sent to PCP

## 2023-09-19 ENCOUNTER — Other Ambulatory Visit: Payer: Self-pay

## 2023-09-19 ENCOUNTER — Ambulatory Visit (HOSPITAL_BASED_OUTPATIENT_CLINIC_OR_DEPARTMENT_OTHER): Payer: Medicare HMO | Admitting: Anesthesiology

## 2023-09-19 ENCOUNTER — Encounter (HOSPITAL_COMMUNITY): Payer: Self-pay | Admitting: Internal Medicine

## 2023-09-19 ENCOUNTER — Ambulatory Visit (HOSPITAL_COMMUNITY)
Admission: RE | Admit: 2023-09-19 | Discharge: 2023-09-19 | Disposition: A | Discharge status: Home / Self Care | Payer: Medicare HMO | Attending: Internal Medicine | Admitting: Internal Medicine

## 2023-09-19 ENCOUNTER — Encounter (HOSPITAL_COMMUNITY)
Admission: RE | Disposition: A | Discharge status: Home / Self Care | Payer: Self-pay | Source: Home / Self Care | Attending: Internal Medicine

## 2023-09-19 ENCOUNTER — Ambulatory Visit (HOSPITAL_COMMUNITY): Payer: Medicare HMO | Admitting: Anesthesiology

## 2023-09-19 DIAGNOSIS — K5520 Angiodysplasia of colon without hemorrhage: Secondary | ICD-10-CM | POA: Insufficient documentation

## 2023-09-19 DIAGNOSIS — Z7984 Long term (current) use of oral hypoglycemic drugs: Secondary | ICD-10-CM | POA: Diagnosis not present

## 2023-09-19 DIAGNOSIS — K648 Other hemorrhoids: Secondary | ICD-10-CM | POA: Diagnosis not present

## 2023-09-19 DIAGNOSIS — D123 Benign neoplasm of transverse colon: Secondary | ICD-10-CM | POA: Diagnosis not present

## 2023-09-19 DIAGNOSIS — Z1211 Encounter for screening for malignant neoplasm of colon: Secondary | ICD-10-CM

## 2023-09-19 DIAGNOSIS — D124 Benign neoplasm of descending colon: Secondary | ICD-10-CM | POA: Diagnosis not present

## 2023-09-19 DIAGNOSIS — E119 Type 2 diabetes mellitus without complications: Secondary | ICD-10-CM | POA: Diagnosis not present

## 2023-09-19 DIAGNOSIS — K5730 Diverticulosis of large intestine without perforation or abscess without bleeding: Secondary | ICD-10-CM | POA: Diagnosis not present

## 2023-09-19 HISTORY — PX: POLYPECTOMY: SHX5525

## 2023-09-19 HISTORY — DX: Personal history of urinary calculi: Z87.442

## 2023-09-19 HISTORY — PX: COLONOSCOPY WITH PROPOFOL: SHX5780

## 2023-09-19 LAB — GLUCOSE, CAPILLARY: Glucose-Capillary: 145 mg/dL — ABNORMAL HIGH (ref 70–99)

## 2023-09-19 SURGERY — COLONOSCOPY WITH PROPOFOL
Anesthesia: General

## 2023-09-19 MED ORDER — SODIUM CHLORIDE 0.9% FLUSH
3.0000 mL | Freq: Two times a day (BID) | INTRAVENOUS | Status: DC
Start: 2023-09-19 — End: 2023-09-19

## 2023-09-19 MED ORDER — LACTATED RINGERS IV SOLN: INTRAVENOUS | Status: DC | PRN

## 2023-09-19 MED ORDER — LIDOCAINE HCL (PF) 2 % IJ SOLN
INTRAMUSCULAR | Status: DC | PRN
  Administered 2023-09-19: 50 mg via INTRADERMAL

## 2023-09-19 MED ORDER — SODIUM CHLORIDE 0.9% FLUSH: 3.0000 mL | INTRAVENOUS | Status: DC | PRN

## 2023-09-19 MED ORDER — PROPOFOL 10 MG/ML IV BOLUS
INTRAVENOUS | Status: DC | PRN
  Administered 2023-09-19: 50 mg via INTRAVENOUS

## 2023-09-19 MED ORDER — PROPOFOL 500 MG/50ML IV EMUL
INTRAVENOUS | Status: DC | PRN
  Administered 2023-09-19: 150 ug/kg/min via INTRAVENOUS

## 2023-09-19 NOTE — Anesthesia Postprocedure Evaluation (Signed)
 Anesthesia Post Note  Patient: Samuel Washington  Procedure(s) Performed: COLONOSCOPY WITH PROPOFOL  POLYPECTOMY  Patient location during evaluation: PACU Anesthesia Type: General Level of consciousness: awake and alert Pain management: pain level controlled Vital Signs Assessment: post-procedure vital signs reviewed and stable Respiratory status: spontaneous breathing, nonlabored ventilation, respiratory function stable and patient connected to nasal cannula oxygen Cardiovascular status: blood pressure returned to baseline and stable Postop Assessment: no apparent nausea or vomiting Anesthetic complications: no   There were no known notable events for this encounter.   Last Vitals:  Vitals:   09/19/23 1400 09/19/23 1406  BP: (!) 79/52 (!) 96/58  Pulse:    Resp:    Temp:    SpO2:      Last Pain:  Vitals:   09/19/23 1355  TempSrc: Axillary  PainSc:                  [name]

## 2023-09-19 NOTE — Anesthesia Procedure Notes (Signed)
 Date/Time: 09/19/2023 1:32 PM  Performed by: Barbarann Verneita RAMAN, CRNAPre-anesthesia Checklist: Patient identified, Emergency Drugs available, Suction available, Timeout performed and Patient being monitored Patient Re-evaluated:Patient Re-evaluated prior to induction Oxygen Delivery Method: Nasal Cannula

## 2023-09-19 NOTE — Anesthesia Preprocedure Evaluation (Signed)
 Anesthesia Evaluation  Patient identified by MRN, date of birth, ID band Patient awake    Reviewed: Allergy & Precautions, H&P , NPO status , Patient's Chart, lab work & pertinent test results, reviewed documented beta blocker date and time   Airway Mallampati: II  TM Distance: >3 FB Neck ROM: full    Dental no notable dental hx. (+) Dental Advisory Given, Teeth Intact   Pulmonary neg pulmonary ROS   Pulmonary exam normal breath sounds clear to auscultation       Cardiovascular Exercise Tolerance: Good negative cardio ROS Normal cardiovascular exam Rhythm:regular Rate:Normal     Neuro/Psych negative neurological ROS  negative psych ROS   GI/Hepatic negative GI ROS, Neg liver ROS,,,  Endo/Other  diabetes, Type 2    Renal/GU negative Renal ROS  negative genitourinary   Musculoskeletal   Abdominal   Peds  Hematology negative hematology ROS (+)   Anesthesia Other Findings   Reproductive/Obstetrics negative OB ROS                             Anesthesia Physical Anesthesia Plan  ASA: 2  Anesthesia Plan: General   Post-op Pain Management: Minimal or no pain anticipated   Induction: Intravenous  PONV Risk Score and Plan: Propofol infusion  Airway Management Planned: Nasal Cannula and Natural Airway  Additional Equipment: None  Intra-op Plan:   Post-operative Plan:   Informed Consent: I have reviewed the patients History and Physical, chart, labs and discussed the procedure including the risks, benefits and alternatives for the proposed anesthesia with the patient or authorized representative who has indicated his/her understanding and acceptance.     Dental Advisory Given  Plan Discussed with: CRNA  Anesthesia Plan Comments:         Anesthesia Quick Evaluation

## 2023-09-19 NOTE — Discharge Instructions (Addendum)
  Colonoscopy Discharge Instructions  Read the instructions outlined below and refer to this sheet in the next few weeks. These discharge instructions provide you with general information on caring for yourself after you leave the hospital. Your doctor may also give you specific instructions. While your treatment has been planned according to the most current medical practices available, unavoidable complications occasionally occur.   ACTIVITY You may resume your regular activity, but move at a slower pace for the next 24 hours.  Take frequent rest periods for the next 24 hours.  Walking will help get rid of the air and reduce the bloated feeling in your belly (abdomen).  No driving for 24 hours (because of the medicine (anesthesia) used during the test).   Do not sign any important legal documents or operate any machinery for 24 hours (because of the anesthesia used during the test).  NUTRITION Drink plenty of fluids.  You may resume your normal diet as instructed by your doctor.  Begin with a light meal and progress to your normal diet. Heavy or fried foods are harder to digest and may make you feel sick to your stomach (nauseated).  Avoid alcoholic beverages for 24 hours or as instructed.  MEDICATIONS You may resume your normal medications unless your doctor tells you otherwise.  WHAT YOU CAN EXPECT TODAY Some feelings of bloating in the abdomen.  Passage of more gas than usual.  Spotting of blood in your stool or on the toilet paper.  IF YOU HAD POLYPS REMOVED DURING THE COLONOSCOPY: No aspirin products for 7 days or as instructed.  No alcohol for 7 days or as instructed.  Eat a soft diet for the next 24 hours.  FINDING OUT THE RESULTS OF YOUR TEST Not all test results are available during your visit. If your test results are not back during the visit, make an appointment with your caregiver to find out the results. Do not assume everything is normal if you have not heard from your  caregiver or the medical facility. It is important for you to follow up on all of your test results.  SEEK IMMEDIATE MEDICAL ATTENTION IF: You have more than a spotting of blood in your stool.  Your belly is swollen (abdominal distention).  You are nauseated or vomiting.  You have a temperature over 101.  You have abdominal pain or discomfort that is severe or gets worse throughout the day.   Your colonoscopy revealed 2 polyp(s) which I removed successfully. Await pathology results, my office will contact you.  Given your age, I do not think we need to perform further colonoscopies for polyp surveillance.  You also have diverticulosis and internal hemorrhoids. I would recommend increasing fiber in your diet or adding OTC Benefiber/Metamucil. Be sure to drink at least 4 to 6 glasses of water daily. Follow-up with GI as needed.   I hope you have a great rest of your week!  Carlin POUR. Cindie, D.O. Gastroenterology and Hepatology [facility]

## 2023-09-19 NOTE — Op Note (Signed)
 [facility] Patient Name: Samuel Washington Procedure Date: 09/19/2023 1:26 PM MRN: [id number] Date of Birth: 02/12/1951 Attending MD: Carlin POUR. Cindie , [place], [id number] CSN: [id number] Age: 73 Admit Type: Outpatient Procedure:                Colonoscopy Indications:              Screening for colorectal malignant neoplasm Providers:                Carlin POUR. Cindie, DO, Emilee Tubb RN, RN, Mickel FREDRIK Shirlean Alexandro, Technician Referring MD:             Carlin POUR. Cindie, DO Medicines:                See the Anesthesia note for documentation of the                            administered medications Complications:            No immediate complications. Estimated Blood Loss:     Estimated blood loss was minimal. Procedure:                Pre-Anesthesia Assessment:                           - The anesthesia plan was to use monitored                            anesthesia care (MAC).                           After obtaining informed consent, the colonoscope                            was passed under direct vision. Throughout the                            procedure, the patient's blood pressure, pulse, and                            oxygen saturations were monitored continuously. The                            [id number]-HQ190L ([id number]) scope was introduced through                            the anus and advanced to the the cecum, identified                            by appendiceal orifice and ileocecal valve. The                            colonoscopy was performed without difficulty. The  patient tolerated the procedure well. The quality                            of the bowel preparation was evaluated using the                            BBPS [facility] Bowel Preparation Scale) with scores                            of: Right Colon = 3, Transverse Colon = 3 and Left                            Colon = 3 (entire mucosa seen well with no residual                             staining, small fragments of stool or opaque                            liquid). The total BBPS score equals 9. Scope In: 1:37:46 PM Scope Out: 1:50:29 PM Scope Withdrawal Time: 0 hours 9 minutes 39 seconds  Total Procedure Duration: 0 hours 12 minutes 43 seconds  Findings:      Non-bleeding internal hemorrhoids were found during endoscopy.      Many large-mouthed and small-mouthed diverticula were found in the       sigmoid colon, descending colon and transverse colon.      A single medium-sized angiodysplastic lesion without bleeding was found       in the cecum.      Two sessile polyps were found in the descending colon and transverse       colon. The polyps were 4 to 6 mm in size. These polyps were removed with       a cold snare. Resection and retrieval were complete.      The exam was otherwise without abnormality. Impression:               - Non-bleeding internal hemorrhoids.                           - Diverticulosis in the sigmoid colon, in the                            descending colon and in the transverse colon.                           - A single non-bleeding colonic angiodysplastic                            lesion.                           - Two 4 to 6 mm polyps in the descending colon and                            in the transverse colon, removed with a cold snare.  Resected and retrieved.                           - The examination was otherwise normal. Moderate Sedation:      Per Anesthesia Care Recommendation:           - Patient has a contact number available for                            emergencies. The signs and symptoms of potential                            delayed complications were discussed with the                            patient. Return to normal activities tomorrow.                            Written discharge instructions were provided to the                            patient.                            - Resume previous diet.                           - Continue present medications.                           - Await pathology results.                           - No repeat colonoscopy due to age.                           - Return to GI clinic PRN. Procedure Code(s):        --- Professional ---                           [phone], Colonoscopy, flexible; with removal of                            tumor(s), polyp(s), or other lesion(s) by snare                            technique Diagnosis Code(s):        --- Professional ---                           Z12.11, Encounter for screening for malignant                            neoplasm of colon                           K64.8, Other hemorrhoids  K55.20, Angiodysplasia of colon without hemorrhage                           D12.4, Benign neoplasm of descending colon                           D12.3, Benign neoplasm of transverse colon (hepatic                            flexure or splenic flexure)                           K57.30, Diverticulosis of large intestine without                            perforation or abscess without bleeding CPT copyright 2022 American Medical Association. All rights reserved. The codes documented in this report are preliminary and upon coder review may  be revised to meet current compliance requirements. Carlin POUR. Cindie, DO Carlin POUR. Cindie, DO 09/19/2023 1:54:20 PM This report has been signed electronically. Number of Addenda: 0

## 2023-09-19 NOTE — Transfer of Care (Signed)
 Immediate Anesthesia Transfer of Care Note  Patient: Samuel Washington  Procedure(s) Performed: COLONOSCOPY WITH PROPOFOL  POLYPECTOMY  Patient Location: Endoscopy Unit  Anesthesia Type:General  Level of Consciousness: drowsy and patient cooperative  Airway & Oxygen Therapy: Patient Spontanous Breathing  Post-op Assessment: Report given to RN and Post -op Vital signs reviewed and stable  Post vital signs: Reviewed and stable  Last Vitals:  Vitals Value Taken Time  BP 75/52 09/19/23 1355  Temp 36.4 C 09/19/23 1355  Pulse 77 09/19/23 1355  Resp 12 09/19/23 1355  SpO2 93 % 09/19/23 1355    Last Pain:  Vitals:   09/19/23 1355  TempSrc: Axillary  PainSc:       Patients Stated Pain Goal: 10 (09/19/23 1314)  Complications: No notable events documented.

## 2023-09-19 NOTE — H&P (Signed)
 Primary Care Physician:  Joshua Rayfield RAMAN, NP Primary Gastroenterologist:  Dr. Cindie  Pre-Procedure History & Physical: HPI:  Samuel Washington is a 73 y.o. male is here for a colonoscopy for colon cancer screening purposes.  Patient denies any family history of colorectal cancer.  Past Medical History:  Diagnosis Date   Diabetes mellitus without complication (HCC)    History of kidney stones     Past Surgical History:  Procedure Laterality Date   ABDOMINAL SURGERY     age 26   FOOT SURGERY Left    4 fractured toes   THROAT SURGERY     lump    Prior to Admission medications   Medication Sig Start Date End Date Taking? Authorizing Provider  gabapentin (NEURONTIN) 300 MG capsule Take 300 mg by mouth daily. 02/19/21  Yes [provider]  lisinopril (ZESTRIL) 5 MG tablet Take 5 mg by mouth daily.   Yes [provider]  traMADol (ULTRAM) 50 MG tablet Take 50 mg by mouth as needed. 02/01/21  Yes [provider]  metFORMIN (GLUCOPHAGE) 1000 MG tablet Take 1,000 mg by mouth every other day. 04/23/23   [provider]  tamsulosin (FLOMAX) 0.4 MG CAPS capsule Take 0.4 mg by mouth daily.    [provider]    Allergies as of 09/03/2023   (No Known Allergies)    History reviewed. No pertinent family history.  Social History   Socioeconomic History   Marital status: Married    Spouse name: Not on file   Number of children: Not on file   Years of education: Not on file   Highest education level: Not on file  Occupational History   Not on file  Tobacco Use   Smoking status: Never   Smokeless tobacco: Never  Vaping Use   Vaping status: Never Used  Substance and Sexual Activity   Alcohol use: Yes    Comment: wine/beer once a week   Drug use: No   Sexual activity: Not on file  Other Topics Concern   Not on file  Social History Narrative   Not on file   Social Drivers of Health   Financial Resource Strain: Not on file  Food  Insecurity: Not on file  Transportation Needs: Not on file  Physical Activity: Not on file  Stress: Not on file  Social Connections: Not on file  Intimate Partner Violence: Not on file    Review of Systems: See HPI, otherwise negative ROS  Physical Exam: Vital signs in last 24 hours: Temp:  [97.5 F (36.4 C)] 97.5 F (36.4 C) (02/07 1314) Pulse Rate:  [74] 74 (02/07 1314) Resp:  [18] 18 (02/07 1314) BP: (121)/(78) 121/78 (02/07 1314) SpO2:  [96 %] 96 % (02/07 1314) Weight:  [99.3 kg] 99.3 kg (02/07 1314)   General:   Alert,  Well-developed, well-nourished, pleasant and cooperative in NAD Head:  Normocephalic and atraumatic. Eyes:  Sclera clear, no icterus.   Conjunctiva pink. Ears:  Normal auditory acuity. Nose:  No deformity, discharge,  or lesions. Msk:  Symmetrical without gross deformities. Normal posture. Extremities:  Without clubbing or edema. Neurologic:  Alert and  oriented x4;  grossly normal neurologically. Skin:  Intact without significant lesions or rashes. Psych:  Alert and cooperative. Normal mood and affect.  Impression/Plan: Samuel Washington is here for a colonoscopy to be performed for colon cancer screening purposes.  The risks of the procedure including infection, bleed, or perforation as well as benefits, limitations, alternatives  and imponderables have been reviewed with the patient. Questions have been answered. All parties agreeable.

## 2023-09-22 ENCOUNTER — Encounter (HOSPITAL_COMMUNITY): Payer: Self-pay | Admitting: Internal Medicine

## 2023-09-22 LAB — SURGICAL PATHOLOGY
